# Patient Record
Sex: Female | Born: 2000 | Race: Black or African American | Hispanic: No | Marital: Single | Smoking: Never smoker
Health system: Southern US, Community
[De-identification: ages and names within clinical notes are randomized; demographics above are authoritative.]

## PROBLEM LIST (undated history)

## (undated) ENCOUNTER — Inpatient Hospital Stay (HOSPITAL_COMMUNITY): Payer: Self-pay

## (undated) DIAGNOSIS — D649 Anemia, unspecified: Secondary | ICD-10-CM

## (undated) DIAGNOSIS — D249 Benign neoplasm of unspecified breast: Secondary | ICD-10-CM

## (undated) HISTORY — PX: NO PAST SURGERIES: SHX2092

## (undated) HISTORY — DX: Anemia, unspecified: D64.9

---
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
## 2021-06-24 ENCOUNTER — Other Ambulatory Visit: Payer: Self-pay | Admitting: Pediatrics

## 2021-06-24 DIAGNOSIS — D241 Benign neoplasm of right breast: Secondary | ICD-10-CM

## 2021-07-08 ENCOUNTER — Ambulatory Visit
Admission: RE | Admit: 2021-07-08 | Discharge: 2021-07-08 | Disposition: A | Discharge status: Home / Self Care | Payer: Medicaid Other | Source: Ambulatory Visit | Attending: Pediatrics | Admitting: Pediatrics

## 2021-07-08 ENCOUNTER — Other Ambulatory Visit: Payer: Self-pay

## 2021-07-08 ENCOUNTER — Other Ambulatory Visit: Payer: Self-pay | Admitting: Pediatrics

## 2021-07-08 DIAGNOSIS — D241 Benign neoplasm of right breast: Secondary | ICD-10-CM

## 2021-07-09 ENCOUNTER — Ambulatory Visit
Admission: RE | Admit: 2021-07-09 | Discharge: 2021-07-09 | Disposition: A | Discharge status: Home / Self Care | Payer: Medicaid Other | Source: Ambulatory Visit | Attending: Pediatrics | Admitting: Pediatrics

## 2021-07-09 ENCOUNTER — Other Ambulatory Visit: Payer: Self-pay | Admitting: Pediatrics

## 2021-07-09 DIAGNOSIS — D241 Benign neoplasm of right breast: Secondary | ICD-10-CM

## 2021-08-03 ENCOUNTER — Ambulatory Visit: Payer: Self-pay | Admitting: Surgery

## 2021-08-03 DIAGNOSIS — D241 Benign neoplasm of right breast: Secondary | ICD-10-CM

## 2021-08-11 ENCOUNTER — Other Ambulatory Visit: Payer: Self-pay | Admitting: Surgery

## 2021-08-11 DIAGNOSIS — D241 Benign neoplasm of right breast: Secondary | ICD-10-CM

## 2021-08-25 ENCOUNTER — Encounter (HOSPITAL_BASED_OUTPATIENT_CLINIC_OR_DEPARTMENT_OTHER): Payer: Self-pay | Admitting: Surgery

## 2021-08-25 ENCOUNTER — Other Ambulatory Visit: Payer: Self-pay

## 2021-09-01 ENCOUNTER — Encounter (HOSPITAL_BASED_OUTPATIENT_CLINIC_OR_DEPARTMENT_OTHER)
Admission: RE | Admit: 2021-09-01 | Discharge: 2021-09-01 | Disposition: A | Discharge status: Home / Self Care | Payer: Medicaid Other | Source: Ambulatory Visit | Attending: Surgery | Admitting: Surgery

## 2021-09-01 ENCOUNTER — Other Ambulatory Visit: Payer: Self-pay

## 2021-09-01 ENCOUNTER — Ambulatory Visit
Admission: RE | Admit: 2021-09-01 | Discharge: 2021-09-01 | Disposition: A | Discharge status: Home / Self Care | Payer: Medicaid Other | Source: Ambulatory Visit | Attending: Surgery | Admitting: Surgery

## 2021-09-01 ENCOUNTER — Other Ambulatory Visit: Payer: Self-pay | Admitting: Surgery

## 2021-09-01 DIAGNOSIS — D241 Benign neoplasm of right breast: Secondary | ICD-10-CM | POA: Diagnosis not present

## 2021-09-01 DIAGNOSIS — Z01812 Encounter for preprocedural laboratory examination: Secondary | ICD-10-CM | POA: Insufficient documentation

## 2021-09-01 LAB — CBC WITH DIFFERENTIAL/PLATELET
Abs Immature Granulocytes: 0.01 10*3/uL (ref 0.00–0.07)
Basophils Absolute: 0.1 10*3/uL (ref 0.0–0.1)
Basophils Relative: 1 %
Eosinophils Absolute: 0.1 10*3/uL (ref 0.0–0.5)
Eosinophils Relative: 1 %
HCT: 34 % — ABNORMAL LOW (ref 36.0–46.0)
Hemoglobin: 10.4 g/dL — ABNORMAL LOW (ref 12.0–15.0)
Immature Granulocytes: 0 %
Lymphocytes Relative: 34 %
Lymphs Abs: 2.3 10*3/uL (ref 0.7–4.0)
MCH: 22.7 pg — ABNORMAL LOW (ref 26.0–34.0)
MCHC: 30.6 g/dL (ref 30.0–36.0)
MCV: 74.2 fL — ABNORMAL LOW (ref 80.0–100.0)
Monocytes Absolute: 0.5 10*3/uL (ref 0.1–1.0)
Monocytes Relative: 8 %
Neutro Abs: 3.9 10*3/uL (ref 1.7–7.7)
Neutrophils Relative %: 56 %
Platelets: 323 10*3/uL (ref 150–400)
RBC: 4.58 MIL/uL (ref 3.87–5.11)
RDW: 17.8 % — ABNORMAL HIGH (ref 11.5–15.5)
WBC: 6.9 10*3/uL (ref 4.0–10.5)
nRBC: 0 % (ref 0.0–0.2)

## 2021-09-01 LAB — COMPREHENSIVE METABOLIC PANEL
ALT: 13 U/L (ref 0–44)
AST: 17 U/L (ref 15–41)
Albumin: 4 g/dL (ref 3.5–5.0)
Alkaline Phosphatase: 49 U/L (ref 38–126)
Anion gap: 5 (ref 5–15)
BUN: 13 mg/dL (ref 6–20)
CO2: 26 mmol/L (ref 22–32)
Calcium: 9.6 mg/dL (ref 8.9–10.3)
Chloride: 106 mmol/L (ref 98–111)
Creatinine, Ser: 0.63 mg/dL (ref 0.44–1.00)
GFR, Estimated: >60 mL/min (ref >60)
Glucose, Bld: 88 mg/dL (ref 70–99)
Potassium: 3.6 mmol/L (ref 3.5–5.1)
Sodium: 137 mmol/L (ref 135–145)
Total Bilirubin: 0.5 mg/dL (ref 0.3–1.2)
Total Protein: 7.2 g/dL (ref 6.5–8.1)

## 2021-09-01 LAB — POCT PREGNANCY, URINE: Preg Test, Ur: NEGATIVE

## 2021-09-01 NOTE — Progress Notes (Signed)

## 2021-09-02 ENCOUNTER — Encounter (HOSPITAL_BASED_OUTPATIENT_CLINIC_OR_DEPARTMENT_OTHER)
Admission: RE | Disposition: A | Discharge status: Home / Self Care | Payer: Self-pay | Source: Home / Self Care | Attending: Surgery

## 2021-09-02 ENCOUNTER — Other Ambulatory Visit: Payer: Self-pay

## 2021-09-02 ENCOUNTER — Ambulatory Visit (HOSPITAL_BASED_OUTPATIENT_CLINIC_OR_DEPARTMENT_OTHER): Payer: Medicaid Other | Admitting: Anesthesiology

## 2021-09-02 ENCOUNTER — Ambulatory Visit (HOSPITAL_BASED_OUTPATIENT_CLINIC_OR_DEPARTMENT_OTHER)
Admission: RE | Admit: 2021-09-02 | Discharge: 2021-09-02 | Disposition: A | Discharge status: Home / Self Care | Payer: Medicaid Other | Attending: Surgery | Admitting: Surgery

## 2021-09-02 ENCOUNTER — Encounter (HOSPITAL_BASED_OUTPATIENT_CLINIC_OR_DEPARTMENT_OTHER): Payer: Self-pay | Admitting: Surgery

## 2021-09-02 ENCOUNTER — Ambulatory Visit
Admission: RE | Admit: 2021-09-02 | Discharge: 2021-09-02 | Disposition: A | Discharge status: Home / Self Care | Payer: Medicaid Other | Source: Ambulatory Visit | Attending: Surgery | Admitting: Surgery

## 2021-09-02 DIAGNOSIS — D241 Benign neoplasm of right breast: Secondary | ICD-10-CM | POA: Insufficient documentation

## 2021-09-02 HISTORY — PX: BREAST LUMPECTOMY WITH RADIOACTIVE SEED LOCALIZATION: SHX6424

## 2021-09-02 HISTORY — DX: Benign neoplasm of unspecified breast: D24.9

## 2021-09-02 SURGERY — BREAST LUMPECTOMY WITH RADIOACTIVE SEED LOCALIZATION
Anesthesia: General | Site: Breast | Laterality: Right

## 2021-09-02 MED ORDER — ACETAMINOPHEN 500 MG PO TABS
ORAL_TABLET | ORAL | Status: AC
  Filled 2021-09-02: qty 2

## 2021-09-02 MED ORDER — SODIUM CHLORIDE 0.9 % IV SOLN
INTRAVENOUS | Status: DC | PRN
  Administered 2021-09-02: 100 mL

## 2021-09-02 MED ORDER — MIDAZOLAM HCL 5 MG/5ML IJ SOLN
INTRAMUSCULAR | Status: DC | PRN
  Administered 2021-09-02: 2 mg via INTRAVENOUS

## 2021-09-02 MED ORDER — CEFAZOLIN SODIUM-DEXTROSE 2-4 GM/100ML-% IV SOLN
2.0000 g | INTRAVENOUS | Status: AC
  Administered 2021-09-02: 2 g via INTRAVENOUS

## 2021-09-02 MED ORDER — FENTANYL CITRATE (PF) 100 MCG/2ML IJ SOLN
INTRAMUSCULAR | Status: AC
  Filled 2021-09-02: qty 2

## 2021-09-02 MED ORDER — BUPIVACAINE-EPINEPHRINE (PF) 0.25% -1:200000 IJ SOLN
INTRAMUSCULAR | Status: DC | PRN
  Administered 2021-09-02: 18 mL

## 2021-09-02 MED ORDER — CEFAZOLIN SODIUM-DEXTROSE 2-4 GM/100ML-% IV SOLN
INTRAVENOUS | Status: AC
  Filled 2021-09-02: qty 100

## 2021-09-02 MED ORDER — FENTANYL CITRATE (PF) 100 MCG/2ML IJ SOLN
INTRAMUSCULAR | Status: DC | PRN
  Administered 2021-09-02 (×2): 25 ug via INTRAVENOUS
  Administered 2021-09-02: 50 ug via INTRAVENOUS

## 2021-09-02 MED ORDER — DEXAMETHASONE SODIUM PHOSPHATE 4 MG/ML IJ SOLN
INTRAMUSCULAR | Status: DC | PRN
  Administered 2021-09-02: 4 mg via INTRAVENOUS

## 2021-09-02 MED ORDER — LACTATED RINGERS IV SOLN: INTRAVENOUS | Status: DC

## 2021-09-02 MED ORDER — CHLORHEXIDINE GLUCONATE CLOTH 2 % EX PADS: 6.0000 | MEDICATED_PAD | Freq: Once | CUTANEOUS | Status: DC

## 2021-09-02 MED ORDER — IBUPROFEN 800 MG PO TABS: 800.0000 mg | ORAL_TABLET | Freq: Three times a day (TID) | ORAL | 0 refills | Status: DC | PRN

## 2021-09-02 MED ORDER — SCOPOLAMINE 1 MG/3DAYS TD PT72: 1.0000 | MEDICATED_PATCH | Freq: Once | TRANSDERMAL | Status: DC

## 2021-09-02 MED ORDER — VANCOMYCIN HCL 500 MG IV SOLR
INTRAVENOUS | Status: DC | PRN
  Administered 2021-09-02: 500 mg via TOPICAL

## 2021-09-02 MED ORDER — MIDAZOLAM HCL 2 MG/2ML IJ SOLN
INTRAMUSCULAR | Status: AC
  Filled 2021-09-02: qty 2

## 2021-09-02 MED ORDER — ACETAMINOPHEN 500 MG PO TABS
1000.0000 mg | ORAL_TABLET | ORAL | Status: AC
  Administered 2021-09-02: 1000 mg via ORAL

## 2021-09-02 MED ORDER — LIDOCAINE 2% (20 MG/ML) 5 ML SYRINGE
INTRAMUSCULAR | Status: DC | PRN
  Administered 2021-09-02: 60 mg via INTRAVENOUS

## 2021-09-02 MED ORDER — ONDANSETRON HCL 4 MG/2ML IJ SOLN
INTRAMUSCULAR | Status: DC | PRN
  Administered 2021-09-02: 4 mg via INTRAVENOUS

## 2021-09-02 MED ORDER — PROPOFOL 10 MG/ML IV BOLUS
INTRAVENOUS | Status: DC | PRN
  Administered 2021-09-02: 200 mg via INTRAVENOUS

## 2021-09-02 MED ORDER — HYDROCODONE-ACETAMINOPHEN 5-325 MG PO TABS: 1.0000 | ORAL_TABLET | Freq: Four times a day (QID) | ORAL | 0 refills | Status: DC | PRN

## 2021-09-02 MED ORDER — SODIUM CHLORIDE 0.9 % IV SOLN
INTRAVENOUS | Status: AC
  Filled 2021-09-02: qty 10

## 2021-09-02 SURGICAL SUPPLY — 51 items
ADH SKN CLS APL DERMABOND .7 (GAUZE/BANDAGES/DRESSINGS) ×1
APL PRP STRL LF DISP 70% ISPRP (MISCELLANEOUS) ×1
APPLIER CLIP 9.375 MED OPEN (MISCELLANEOUS)
APR CLP MED 9.3 20 MLT OPN (MISCELLANEOUS)
BINDER BREAST LRG (GAUZE/BANDAGES/DRESSINGS) IMPLANT
BINDER BREAST MEDIUM (GAUZE/BANDAGES/DRESSINGS) IMPLANT
BINDER BREAST XLRG (GAUZE/BANDAGES/DRESSINGS) ×2 IMPLANT
BINDER BREAST XXLRG (GAUZE/BANDAGES/DRESSINGS) IMPLANT
BLADE SURG 15 STRL LF DISP TIS (BLADE) ×1 IMPLANT
BLADE SURG 15 STRL SS (BLADE) ×2
CANISTER SUC SOCK COL 7IN (MISCELLANEOUS) IMPLANT
CANISTER SUCT 1200ML W/VALVE (MISCELLANEOUS) ×2 IMPLANT
CHLORAPREP W/TINT 26 (MISCELLANEOUS) ×2 IMPLANT
CLIP APPLIE 9.375 MED OPEN (MISCELLANEOUS) IMPLANT
COVER BACK TABLE 60X90IN (DRAPES) ×2 IMPLANT
COVER MAYO STAND STRL (DRAPES) ×2 IMPLANT
COVER PROBE W GEL 5X96 (DRAPES) ×2 IMPLANT
DECANTER SPIKE VIAL GLASS SM (MISCELLANEOUS) IMPLANT
DERMABOND ADVANCED (GAUZE/BANDAGES/DRESSINGS) ×1
DERMABOND ADVANCED .7 DNX12 (GAUZE/BANDAGES/DRESSINGS) ×1 IMPLANT
DRAPE LAPAROSCOPIC ABDOMINAL (DRAPES) IMPLANT
DRAPE LAPAROTOMY 100X72 PEDS (DRAPES) ×2 IMPLANT
DRAPE UTILITY XL STRL (DRAPES) ×2 IMPLANT
ELECT COATED BLADE 2.86 ST (ELECTRODE) ×2 IMPLANT
ELECT REM PT RETURN 9FT ADLT (ELECTROSURGICAL) ×2
ELECTRODE REM PT RTRN 9FT ADLT (ELECTROSURGICAL) ×1 IMPLANT
GLOVE SRG 8 PF TXTR STRL LF DI (GLOVE) ×1 IMPLANT
GLOVE SURG LTX SZ8 (GLOVE) ×2 IMPLANT
GLOVE SURG UNDER POLY LF SZ8 (GLOVE) ×2
GOWN STRL REUS W/ TWL LRG LVL3 (GOWN DISPOSABLE) ×2 IMPLANT
GOWN STRL REUS W/ TWL XL LVL3 (GOWN DISPOSABLE) ×1 IMPLANT
GOWN STRL REUS W/TWL LRG LVL3 (GOWN DISPOSABLE) ×4
GOWN STRL REUS W/TWL XL LVL3 (GOWN DISPOSABLE) ×2
HEMOSTAT ARISTA ABSORB 3G PWDR (HEMOSTASIS) IMPLANT
HEMOSTAT SNOW SURGICEL 2X4 (HEMOSTASIS) IMPLANT
KIT MARKER MARGIN INK (KITS) ×2 IMPLANT
NDL HYPO 25X1 1.5 SAFETY (NEEDLE) ×1 IMPLANT
NEEDLE HYPO 25X1 1.5 SAFETY (NEEDLE) ×2 IMPLANT
NS IRRIG 1000ML POUR BTL (IV SOLUTION) ×2 IMPLANT
PACK BASIN DAY SURGERY FS (CUSTOM PROCEDURE TRAY) ×2 IMPLANT
PENCIL SMOKE EVACUATOR (MISCELLANEOUS) ×2 IMPLANT
SLEEVE SCD COMPRESS KNEE MED (STOCKING) ×2 IMPLANT
SPONGE T-LAP 4X18 ~~LOC~~+RFID (SPONGE) ×2 IMPLANT
SUT MNCRL AB 4-0 PS2 18 (SUTURE) ×2 IMPLANT
SUT SILK 2 0 SH (SUTURE) IMPLANT
SUT VICRYL 3-0 CR8 SH (SUTURE) ×2 IMPLANT
SYR CONTROL 10ML LL (SYRINGE) ×2 IMPLANT
TOWEL GREEN STERILE FF (TOWEL DISPOSABLE) ×2 IMPLANT
TRAY FAXITRON CT DISP (TRAY / TRAY PROCEDURE) ×2 IMPLANT
TUBE CONNECTING 20X1/4 (TUBING) ×1 IMPLANT
YANKAUER SUCT BULB TIP NO VENT (SUCTIONS) ×1 IMPLANT

## 2021-09-02 NOTE — H&P (Signed)
Subjective   Chief Complaint: breast surgery consult   History of Present Illness: Kimberly Wise is a 20 y.o. female who is seen today as an office consultation at the request of Dr. Redmond Baseman for evaluation of breast surgery consult Enlarging right breast mass   Patient relates a 3-year history of right breast mass. She has multiple masses in both breasts which were evaluated in 2019 with ultrasound. On the right side in the subareolar position, a previously measured 3.2 cm mass consistent with fibroadenoma has increased to 6.4 cm. Core biopsy showed fibroadenoma. She had multiple other masses noted with some incremental changes but with the ultrasound appearance of fibroadenoma. No family history of breast issues. No significant pain at this point time.  Review of Systems: A complete review of systems was obtained from the patient. I have reviewed this information and discussed as appropriate with the patient. See HPI as well for other ROS.  ROS   Medical History: Past Medical History:  Diagnosis Date   Anemia   There is no problem list on file for this patient.  No past surgical history on file.   No Known Allergies  No current outpatient medications on file prior to visit.   No current facility-administered medications on file prior to visit.   No family history on file.   Social History   Tobacco Use  Smoking Status Never Smoker  Smokeless Tobacco Never Used    Social History   Socioeconomic History   Marital status: Single  Tobacco Use   Smoking status: Never Smoker   Smokeless tobacco: Never Used  Scientific laboratory technician Use: Never used  Substance and Sexual Activity   Alcohol use: Not Currently   Drug use: Never   Objective:   Vitals:  08/03/21 0921  BP: 118/80  Pulse: 88  Temp: 36.7 C (98 F)  SpO2: 100%  Weight: 76.9 kg (169 lb 9.6 oz)  Height: 172.7 cm ('5\' 8"'$ )   Body mass index is 25.79 kg/m.    General appearance - alert, well appearing, and in  no distress Mental status - alert, oriented to person, place, and time Neck - supple, no significant adenopathy, no axillary adenopathy  Lymphatics - no palpable lymphadenopathy Breasts - breasts appear normal, no suspicious masses, no skin or nipple changes or axillary nodes, right breast mass posterior to NAC 6 cm mobily rubbery Neurological - alert, oriented, normal speech, no focal findings or movement disorder noted Musculoskeletal - no muscular tenderness noted Breasts are dense Appropriate for age   Labs, Imaging and Diagnostic Testing:  CLINICAL DATA: Follow-up for probably benign RIGHT breast masses. The patient was last evaluated 07/18/2018 for numerous hypoechoic circumscribed masses for which ultrasound was recommended in 6 months. Patient believes mass in the retroareolar region of the RIGHT breast is larger.   EXAM: ULTRASOUND OF THE RIGHT BREAST   COMPARISON: 07/18/2018   FINDINGS: On physical exam, I palpate firm mobile smooth mass in the retroareolar region of the RIGHT breast. I palpate adjacent masses in the 7 o'clock location of the RIGHT breast and a smaller mobile mass in the 2 o'clock location.   Targeted ultrasound is performed, showing numerous hypoechoic masses:   In the 6 o'clock location 2 centimeters from the nipple, circumscribed oval parallel hypoechoic mass has posterior acoustic enhancement measures 2.9 x 1.4 x 2.5 centimeters. Previously this mass measured 1.6 x 1.1 x 1.9 centimeters.   Just superficial to this mass is a hypoechoic parallel oval circumscribed mass  measuring 1.4 x 0.4 x 1.8 centimeters. Previously this mass measured 0.9 x 0.5 x 0.9 centimeters.   In the 7 o'clock location 3 centimeters from the nipple a circumscribed oval hypoechoic parallel mass was not previously imaged and measures 3.6 x 3.8 x 1.4 centimeters.   In the 4 o'clock retroareolar region, a circumscribed oval hypoechoic parallel mass with posterior acoustic  enhancement is 6.4 x 5.9 x 3.0 centimeters. Previously this mass measured 3.2 x 1.5 x 3.0 centimeters.   In the 2 o'clock location 4 centimeters from the nipple, oval hypoechoic oval circumscribed mass is 2.0 x 2.0 x 1.1 centimeters. This mass was not imaged previously.   Evaluation of the RIGHT axilla shows mildly prominent lymph nodes.   IMPRESSION: Increased size of all previously imaged masses in the RIGHT breast. Largest mass in the retroareolar region of the RIGHT breast, measuring 6.4 centimeters, previously 3.2 centimeters. There are 2 new masses in the 7 o'clock and 2 o'clock locations of the RIGHT breast.   RECOMMENDATION: Recommend ultrasound-guided core biopsy of mass in the 4 o'clock retroareolar region of the RIGHT breast.   I have discussed the findings and recommendations with the patient. If applicable, a reminder letter will be sent to the patient regarding the next appointment.   BI-RADS CATEGORY 4: Suspicious.     Electronically Signed By: Nolon Nations M.D. On: 07/08/2021 11:18  Diagnosis Breast, right, needle core biopsy, 4 o'clock, retroareolar - FIBROADENOMA. - NO EVIDENCE OF MALIGNANCY. Assessment and Plan:  Diagnoses and all orders for this visit:  Fibroadenoma of breast, right    Discussed right breast seed localized lumpectomy. Pros and cons of surgery discussed complications of cosmesis, nipple loss and/or numbness, disfiguration, the need for other operations given her multiple other nodules which are more than likely fibroadenomas and future ability to breast-feed. Given increasing size she has opted for right breast seed localized lumpectomy. Risk of bleeding. Infection. nipple loss, numbness, cosmetic change and need for other operations. Observation discussed as other option. Long term expectations discussed. She agrees to proceed   No follow-ups on file.  Kennieth Francois, MD

## 2021-09-02 NOTE — Interval H&P Note (Signed)
History and Physical Interval Note:  09/02/2021 10:34 AM  Kimberly Wise  has presented today for surgery, with the diagnosis of [place].  The various methods of treatment have been discussed with the patient and family. After consideration of risks, benefits and other options for treatment, the patient has consented to  Procedure(s): RIGHT BREAST LUMPECTOMY WITH RADIOACTIVE SEED LOCALIZATION (Right) as a surgical intervention.  The patient's history has been reviewed, patient examined, no change in status, stable for surgery.  I have reviewed the patient's chart and labs.  Questions were answered to the patient's satisfaction.     [name]

## 2021-09-02 NOTE — Anesthesia Postprocedure Evaluation (Signed)
Anesthesia Post Note  Patient: Scientist, product/process development  Procedure(s) Performed: RIGHT BREAST LUMPECTOMY WITH RADIOACTIVE SEED LOCALIZATION (Right: Breast)     Patient location during evaluation: PACU Anesthesia Type: General Level of consciousness: awake and alert Pain management: pain level controlled Vital Signs Assessment: post-procedure vital signs reviewed and stable Respiratory status: spontaneous breathing, nonlabored ventilation and respiratory function stable Cardiovascular status: blood pressure returned to baseline and stable Postop Assessment: no apparent nausea or vomiting Anesthetic complications: no   No notable events documented.  Last Vitals:  Vitals:   09/02/21 1215 09/02/21 1230  BP: 111/70 113/78  Pulse: 65 78  Resp: 12 14  Temp:    SpO2: 100% 100%    Last Pain:  Vitals:   09/02/21 1230  TempSrc:   PainSc: 0-No pain                 Lynda Rainwater

## 2021-09-02 NOTE — Op Note (Signed)
Preoperative diagnosis:  Right breast fibroadenoma  Postoperative diagnosis: Same  Procedure: right breast seed localized lumpectomy of large right breast fibroadenoma central  Surgeon: Erroll Luna, MD  Assistant: Dr. Drucie Ip, MD  I was personally present during the key and critical portions of this procedure and immediately available throughout the entire procedure, as documented in my operative note.   Anesthesia: LMA with 0.25% Marcaine plain  EBL: Minimal  Drains none  Indications for procedure: The patient is an 20 year old female with a large right breast fibroadenoma that is growing.  It was core biopsy proven to be a fibroadenoma but due to increase in size she desired excision.  Risks and benefits of surgery as well as cosmetic deformity and potential damage to her nipple and potential breast-feeding issues were discussed with the patient.  Risk of bleeding, infection, cosmesis, breast-feeding, pain, cosmetic deformity, the need further treatments and/or procedures discussed.  Observation discussed.  She opted for lumpectomy of the larger 1.  She  has other fibroadenomas as well but these had not changed much.  Description of procedure: The patient was met in the holding area.  Neoprobe used to identify the seed in her right breast.  The procedure was reviewed as well as complications.  She agreed to proceed.  She was then taken back to the operating room.  She is placed supine upon the OR table.  After induction of general esthesia the right breast was prepped and draped in a sterile fashion and timeout performed.  Proper patient, site and procedure were verified and films were available for review.  Neoprobe used to identify the seed at about 6:00 at the level of the nipple areolar complex.  Curvilinear incision was made along the inferior border of the nipple areolar complex.  The mass was easily visible.  We are able to dissect this out bluntly.  We remove the entire mass  which measured well over 6 cm x 7 cm.  The Faxitron image revealed the seed and clip to be in the specimen.  Just inferior to that at the 7 to 8 o'clock position was an extension of this.  I went ahead and dissected this out as well this had the appearance of a fibroadenoma.  This was removed in its entirety.  Both specimens were sent to pathology for further evaluation.  There were no other large areas of fibroadenoma noted on exam.  These 2 are almost contiguous with each other.  Hemostasis was then achieved with cautery.  The wound was irrigated.  Vancomycin powder placed in the cavity.  Wound was then closed with 3-0 Vicryl and 4-0 Monocryl.  Local anesthetic was infiltrated around the lumpectomy cavity.  Dermabond applied.  All counts found to be correct.  Breast binder placed.  The patient was awoke extubated taken to recovery in satisfactory condition.

## 2021-09-02 NOTE — Anesthesia Preprocedure Evaluation (Signed)
Anesthesia Evaluation  Patient identified by MRN, date of birth, ID band Patient awake    Reviewed: Allergy & Precautions, NPO status , Patient's Chart, lab work & pertinent test results  History of Anesthesia Complications Negative for: history of anesthetic complications  Airway Mallampati: I  TM Distance: >3 FB Neck ROM: Full    Dental no notable dental hx.    Pulmonary neg pulmonary ROS,    Pulmonary exam normal        Cardiovascular negative cardio ROS Normal cardiovascular exam     Neuro/Psych negative neurological ROS  negative psych ROS   GI/Hepatic negative GI ROS, (+)     substance abuse  marijuana use,   Endo/Other  negative endocrine ROS  Renal/GU negative Renal ROS  negative genitourinary   Musculoskeletal negative musculoskeletal ROS (+)   Abdominal   Peds  Hematology  (+) anemia , Hgb 10.4   Anesthesia Other Findings Right breast fibroadenoma  Reproductive/Obstetrics                            Anesthesia Physical Anesthesia Plan  ASA: 2  Anesthesia Plan: General   Post-op Pain Management:    Induction: Intravenous  PONV Risk Score and Plan: 3 and Treatment may vary due to age or medical condition, Ondansetron, Dexamethasone, Midazolam and Scopolamine patch - Pre-op  Airway Management Planned: LMA  Additional Equipment: None  Intra-op Plan:   Post-operative Plan: Extubation in OR  Informed Consent: I have reviewed the patients History and Physical, chart, labs and discussed the procedure including the risks, benefits and alternatives for the proposed anesthesia with the patient or authorized representative who has indicated his/her understanding and acceptance.     Dental advisory given  Plan Discussed with: CRNA  Anesthesia Plan Comments:         Anesthesia Quick Evaluation

## 2021-09-02 NOTE — Anesthesia Procedure Notes (Signed)
Procedure Name: LMA Insertion Date/Time: 09/02/2021 11:07 AM Performed by: Signe Colt, CRNA Pre-anesthesia Checklist: Patient identified, Emergency Drugs available, Suction available and Patient being monitored Patient Re-evaluated:Patient Re-evaluated prior to induction Oxygen Delivery Method: Circle System Utilized Preoxygenation: Pre-oxygenation with 100% oxygen Induction Type: IV induction Ventilation: Mask ventilation without difficulty LMA: LMA inserted LMA Size: 4.0 Number of attempts: 1 Airway Equipment and Method: bite block Placement Confirmation: positive ETCO2 Tube secured with: Tape Dental Injury: Teeth and Oropharynx as per pre-operative assessment

## 2021-09-02 NOTE — Discharge Instructions (Addendum)
[facility] Office Phone Number [phone]  BREAST BIOPSY/ PARTIAL MASTECTOMY: POST OP INSTRUCTIONS  Always review your discharge instruction sheet given to you by the facility where your surgery was performed.  IF YOU HAVE DISABILITY OR FAMILY LEAVE FORMS, YOU MUST BRING THEM TO THE OFFICE FOR PROCESSING.  DO NOT GIVE THEM TO YOUR DOCTOR.  A prescription for pain medication may be given to you upon discharge.  Take your pain medication as prescribed, if needed.  If narcotic pain medicine is not needed, then you may take acetaminophen (Tylenol) or ibuprofen (Advil) as needed. Take your usually prescribed medications unless otherwise directed If you need a refill on your pain medication, please contact your pharmacy.  They will contact our office to request authorization.  Prescriptions will not be filled after 5pm or on week-ends. You should eat very light the first 24 hours after surgery, such as soup, crackers, pudding, etc.  Resume your normal diet the day after surgery. Most patients will experience some swelling and bruising in the breast.  Ice packs and a good support bra will help.  Swelling and bruising can take several days to resolve.  It is common to experience some constipation if taking pain medication after surgery.  Increasing fluid intake and taking a stool softener will usually help or prevent this problem from occurring.  A mild laxative (Milk of Magnesia or Miralax) should be taken according to package directions if there are no bowel movements after 48 hours. Unless discharge instructions indicate otherwise, you may remove your bandages 24-48 hours after surgery, and you may shower at that time.  You may have steri-strips (small skin tapes) in place directly over the incision.  These strips should be left on the skin for 7-10 days.  If your surgeon used skin glue on the incision, you may shower in 24 hours.  The glue will flake off over the next 2-3 weeks.  Any  sutures or staples will be removed at the office during your follow-up visit. ACTIVITIES:  You may resume regular daily activities (gradually increasing) beginning the next day.  Wearing a good support bra or sports bra minimizes pain and swelling.  You may have sexual intercourse when it is comfortable. You may drive when you no longer are taking prescription pain medication, you can comfortably wear a seatbelt, and you can safely maneuver your car and apply brakes. RETURN TO WORK:  ______________________________________________________________________________________ You should see your doctor in the office for a follow-up appointment approximately two weeks after your surgery.  Your doctor's nurse will typically make your follow-up appointment when she calls you with your pathology report.  Expect your pathology report 2-3 business days after your surgery.  You may call to check if you do not hear from us after three days. OTHER INSTRUCTIONS: _______________________________________________________________________________________________ _____________________________________________________________________________________________________________________________________ _____________________________________________________________________________________________________________________________________ _____________________________________________________________________________________________________________________________________  WHEN TO CALL YOUR DOCTOR: Fever over 101.0 Nausea and/or vomiting. Extreme swelling or bruising. Continued bleeding from incision. Increased pain, redness, or drainage from the incision.  The clinic staff is available to answer your questions during regular business hours.  Please don't hesitate to call and ask to speak to one of the nurses for clinical concerns.  If you have a medical emergency, go to the nearest emergency room or call 911.  A surgeon from [facility] is always on call at the hospital.  For further questions, please visit [email or web address]    Post Anesthesia Home Care Instructions  Activity: Get plenty of rest for the remainder of   remainder of the day. A responsible individual must stay with you for 24 hours following the procedure.  For the next 24 hours, DO NOT: -Drive a car -Paediatric nurse -Drink alcoholic beverages -Take any medication unless instructed by your physician -Make any legal decisions or sign important papers.  Meals: Start with liquid foods such as gelatin or soup. Progress to regular foods as tolerated. Avoid greasy, spicy, heavy foods. If nausea and/or vomiting occur, drink only clear liquids until the nausea and/or vomiting subsides. Call your physician if vomiting continues.  Special Instructions/Symptoms: Your throat may feel dry or sore from the anesthesia or the breathing tube placed in your throat during surgery. If this causes discomfort, gargle with warm salt water. The discomfort should disappear within 24 hours.  If you had a scopolamine patch placed behind your ear for the management of post- operative nausea and/or vomiting:  1. The medication in the patch is effective for 72 hours, after which it should be removed.  Wrap patch in a tissue and discard in the trash. Wash hands thoroughly with soap and water. 2. You may remove the patch earlier than 72 hours if you experience unpleasant side effects which may include dry mouth, dizziness or visual disturbances. 3. Avoid touching the patch. Wash your hands with soap and water after contact with the patch.  No tylenol until after 4pm if needed today.              RETURN TO SCHOOL 09/07/2021  No restrictions

## 2021-09-02 NOTE — Transfer of Care (Signed)
Immediate Anesthesia Transfer of Care Note  Patient: Kimberly Wise  Procedure(s) Performed: RIGHT BREAST LUMPECTOMY WITH RADIOACTIVE SEED LOCALIZATION (Right: Breast)  Patient Location: PACU  Anesthesia Type:General  Level of Consciousness: drowsy and patient cooperative  Airway & Oxygen Therapy: Patient Spontanous Breathing and Patient connected to face mask oxygen  Post-op Assessment: Report given to RN and Post -op Vital signs reviewed and stable  Post vital signs: Reviewed and stable  Last Vitals:  Vitals Value Taken Time  BP    Temp    Pulse 66 09/02/21 1213  Resp    SpO2 96 % 09/02/21 1213  Vitals shown include unvalidated device data.  Last Pain:  Vitals:   09/02/21 0937  TempSrc: Oral  PainSc: 0-No pain      Patients Stated Pain Goal: 7 ([id number])  Complications: No notable events documented.

## 2021-09-03 ENCOUNTER — Encounter (HOSPITAL_BASED_OUTPATIENT_CLINIC_OR_DEPARTMENT_OTHER): Payer: Self-pay | Admitting: Surgery

## 2021-09-04 LAB — SURGICAL PATHOLOGY

## 2021-09-05 ENCOUNTER — Encounter: Payer: Self-pay | Admitting: Surgery

## 2022-04-14 ENCOUNTER — Encounter (HOSPITAL_COMMUNITY): Payer: Self-pay

## 2022-06-13 IMAGING — US US BREAST*R* LIMITED INC AXILLA
1 series · 13 of 25 positions shown · non-contrast
Comparison: 07/18/2018

CLINICAL DATA: Follow-up for probably benign RIGHT breast masses.
The patient was last evaluated 07/18/2018 for numerous hypoechoic
circumscribed masses for which ultrasound was recommended in 6
months. Patient believes mass in the retroareolar region of the
RIGHT breast is larger.

EXAM:
ULTRASOUND OF THE RIGHT BREAST

[Series 1: us breast*right* limited inc axilla · 0.06mm/px · 13 of 27 slices shown]
[im 1/27]
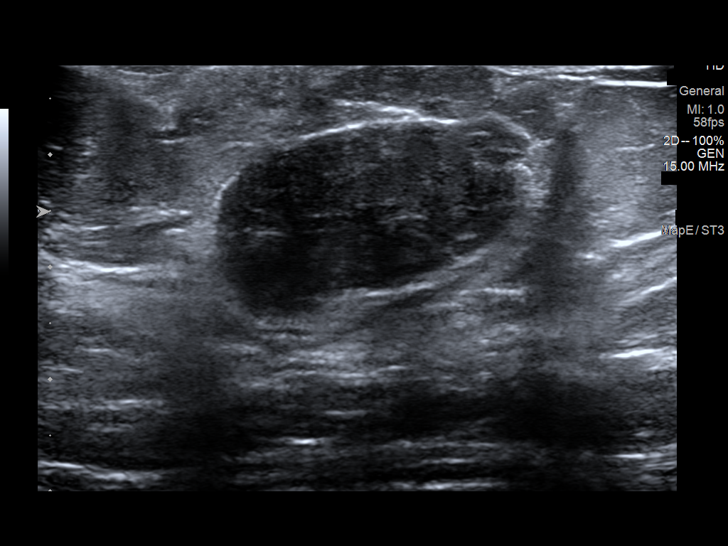
[im 3/27]
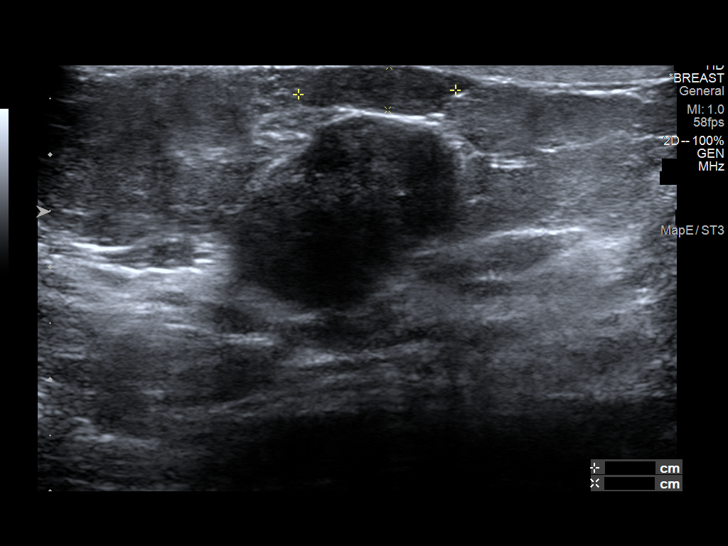
[im 5/27]
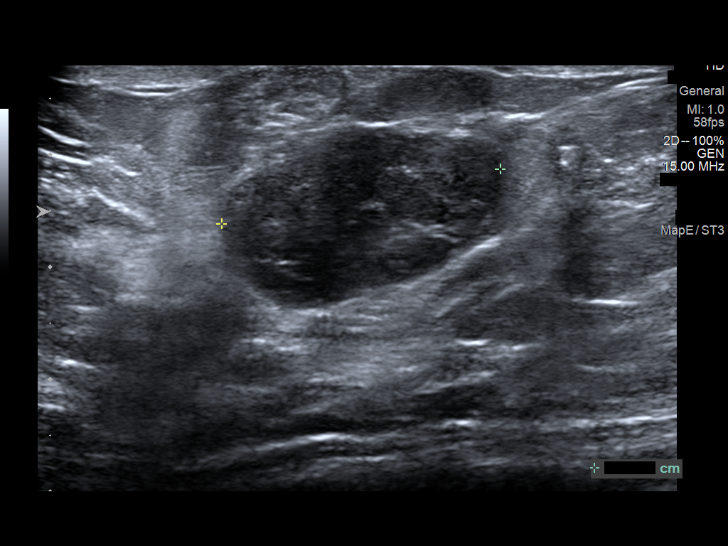
[im 7/27]
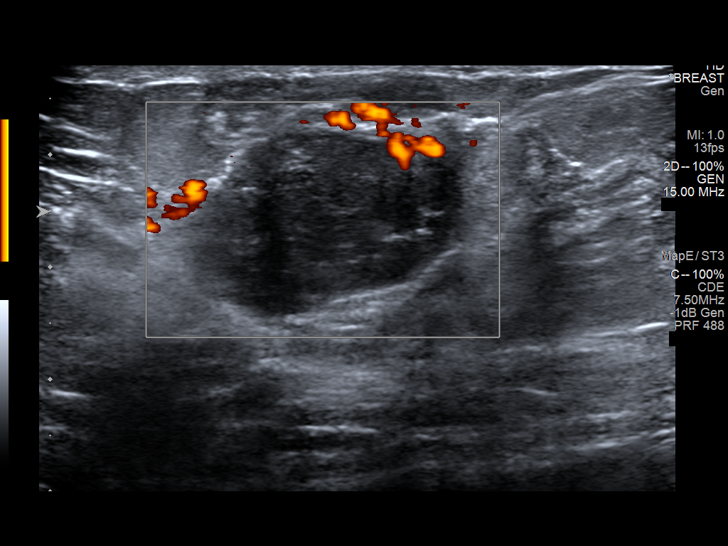
[im 9/27]
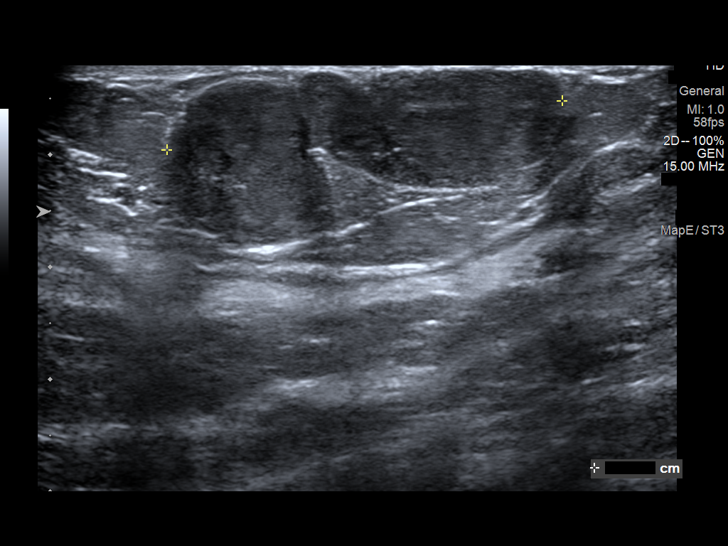
[im 11/27]
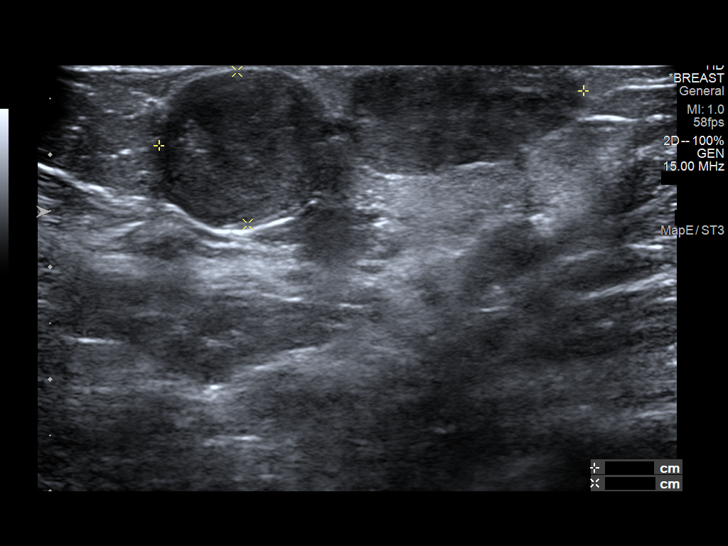
[im 14/27]
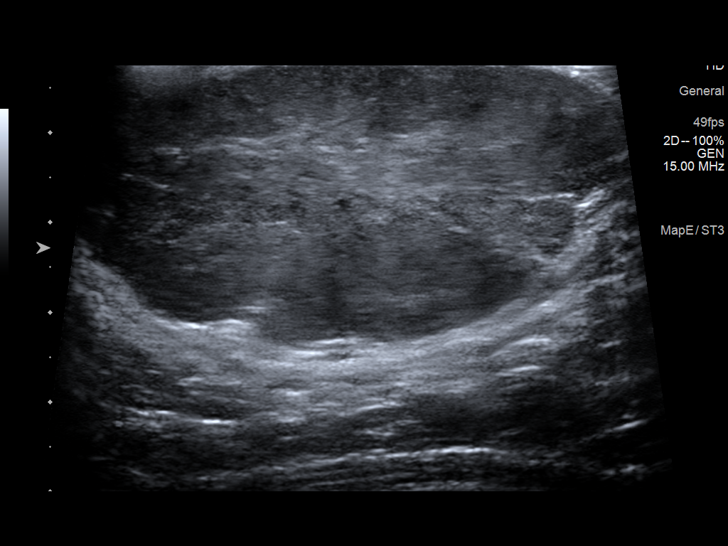
[im 16/27]
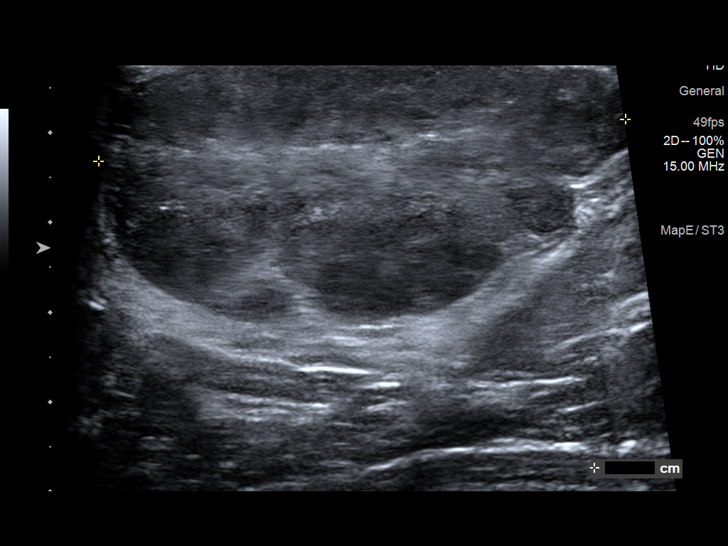
[im 18/27]
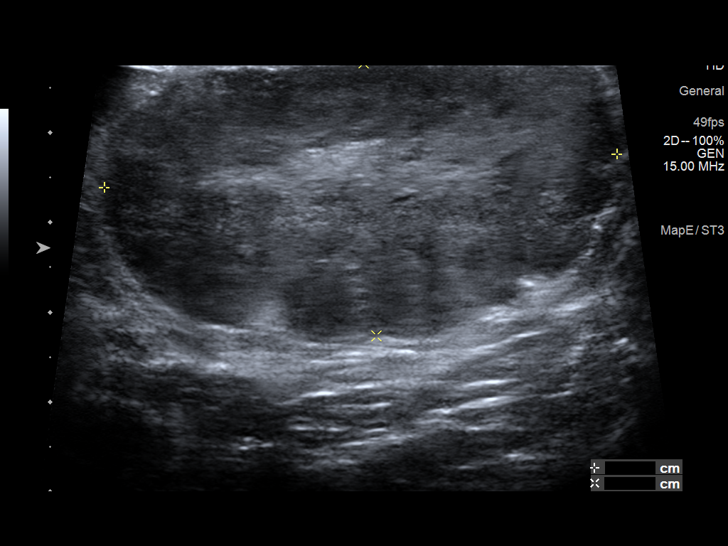
[im 20/27]
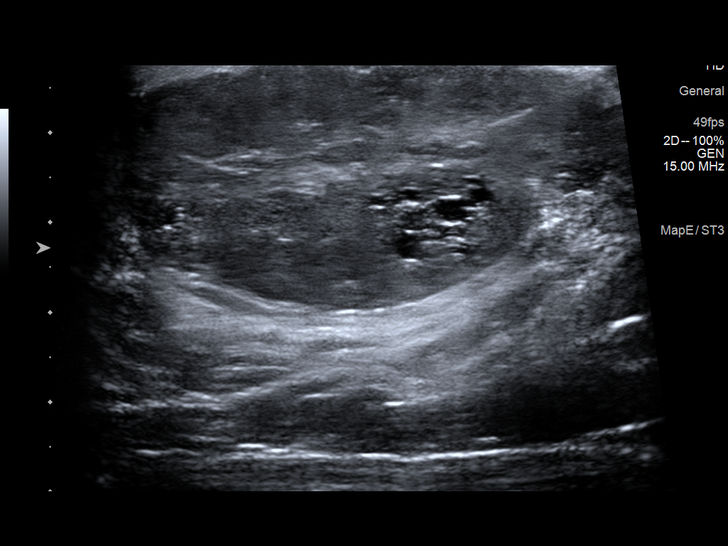
[im 22/27]
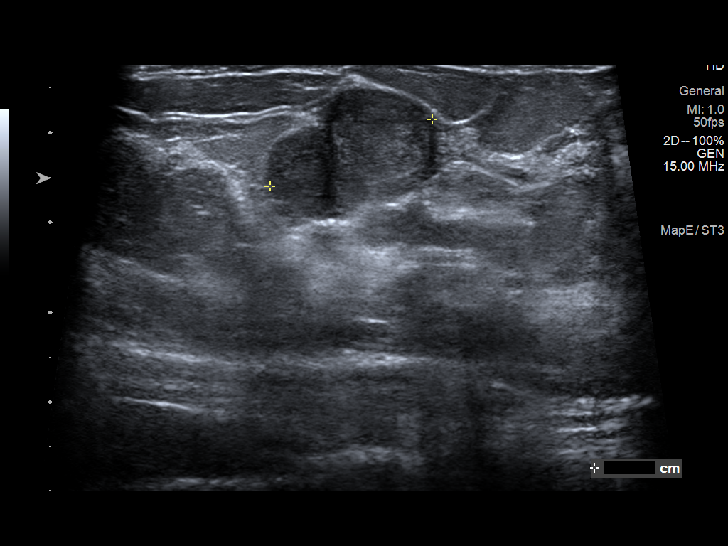
[im 24/27]
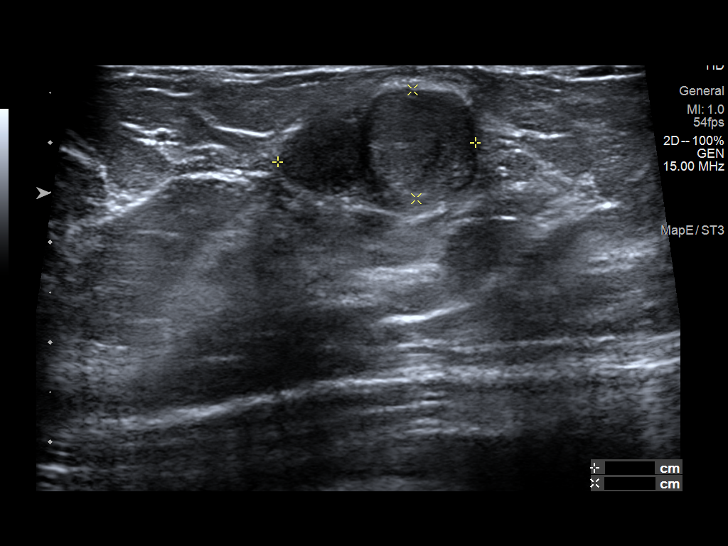
[im 27/27]
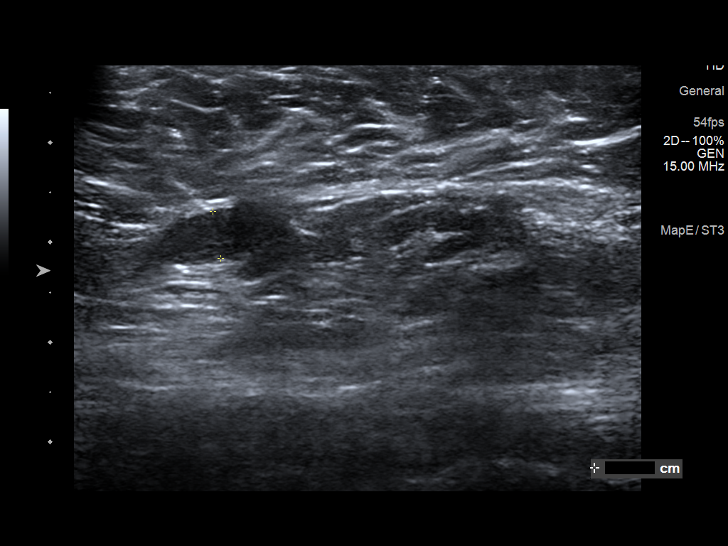

[13 of 25 positions shown; findings below may reference images not displayed]

FINDINGS: On physical exam, I palpate firm mobile smooth mass in the
retroareolar region of the RIGHT breast. I palpate adjacent masses
in the 7 o'clock location of the RIGHT breast and a smaller mobile
mass in the 2 o'clock location.

Targeted ultrasound is performed, showing numerous hypoechoic
masses:

In the 6 o'clock location 2 centimeters from the nipple,
circumscribed oval parallel hypoechoic mass has posterior acoustic
enhancement measures 2.9 x 1.4 x 2.5 centimeters. Previously this
mass measured 1.6 x 1.1 x 1.9 centimeters.

Just superficial to this mass is a hypoechoic parallel oval
circumscribed mass measuring 1.4 x 0.4 x 1.8 centimeters. Previously
this mass measured 0.9 x 0.5 x 0.9 centimeters.

In the 7 o'clock location 3 centimeters from the nipple a
circumscribed oval hypoechoic parallel mass was not previously
imaged and measures 3.6 x 3.8 x 1.4 centimeters.

In the 4 o'clock retroareolar region, a circumscribed oval
hypoechoic parallel mass with posterior acoustic enhancement is
x 5.9 x 3.0 centimeters. Previously this mass measured 3.2 x 1.5 x
3.0 centimeters.

In the 2 o'clock location 4 centimeters from the nipple, oval
hypoechoic oval circumscribed mass is 2.0 x 2.0 x 1.1 centimeters.
This mass was not imaged previously.

Evaluation of the RIGHT axilla shows mildly prominent lymph nodes.
IMPRESSION: Increased size of all previously imaged masses in the RIGHT breast.
Largest mass in the retroareolar region of the RIGHT breast,
measuring 6.4 centimeters, previously 3.2 centimeters. There are 2
new masses in the 7 o'clock and 2 o'clock locations of the RIGHT
breast.

RECOMMENDATION:
Recommend ultrasound-guided core biopsy of mass in the 4 o'clock
retroareolar region of the RIGHT breast.

I have discussed the findings and recommendations with the patient.
If applicable, a reminder letter will be sent to the patient
regarding the next appointment.

BI-RADS CATEGORY  4: Suspicious.

## 2022-06-14 IMAGING — US US  BREAST BX W/ LOC DEV 1ST LESION IMG BX SPEC US GUIDE*R*
1 series · 12 of 15 positions shown · non-contrast
Comparison: Previous exam(s).
COMPARISON: Previous exam(s).

Addendum:
CLINICAL DATA: Patient presents for ultrasound-guided core needle
biopsy of a large, 4 o'clock, retroareolar right breast mass. She
was found multiple circumscribed similar appearing right breast
masses on diagnostic ultrasound dated 07/08/2021 some new and others
enlarged compared to an ultrasound dated 07/18/2018. The 4 o'clock
retroareolar mass is the largest of the 5 discrete right breast
masses.

EXAM:
ULTRASOUND GUIDED RIGHT BREAST CORE NEEDLE BIOPSY

[Series 1: us breast bx w/ loc dev 1st lesion img bx spec us  · 0.07mm/px · 12 of 15 slices shown]
[im 1/15]
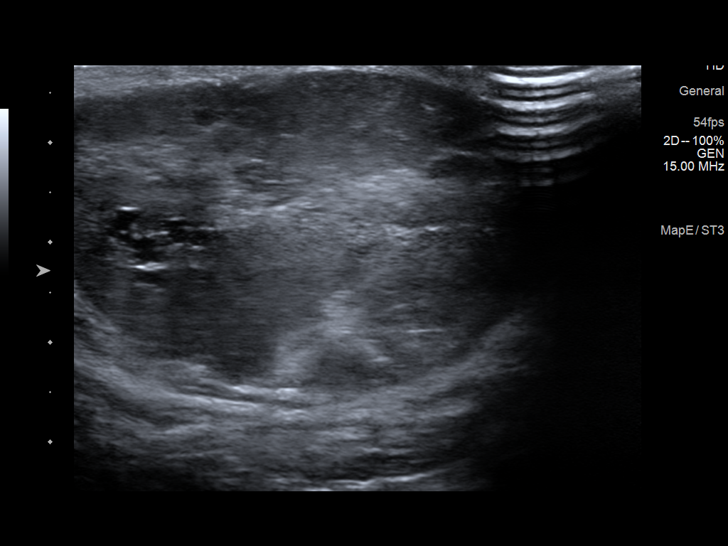
[im 2/15]
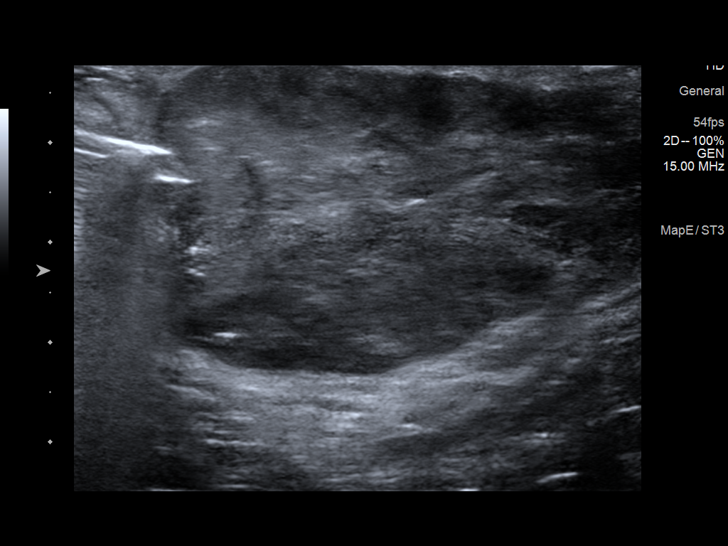
[im 4/15]
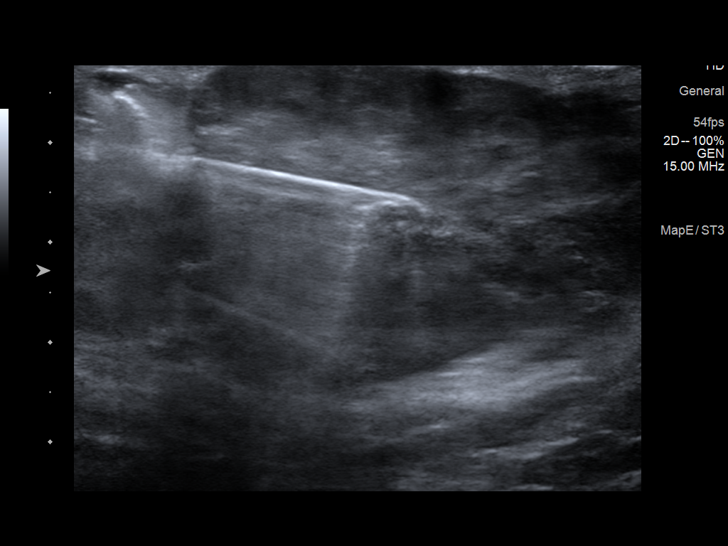
[im 5/15]
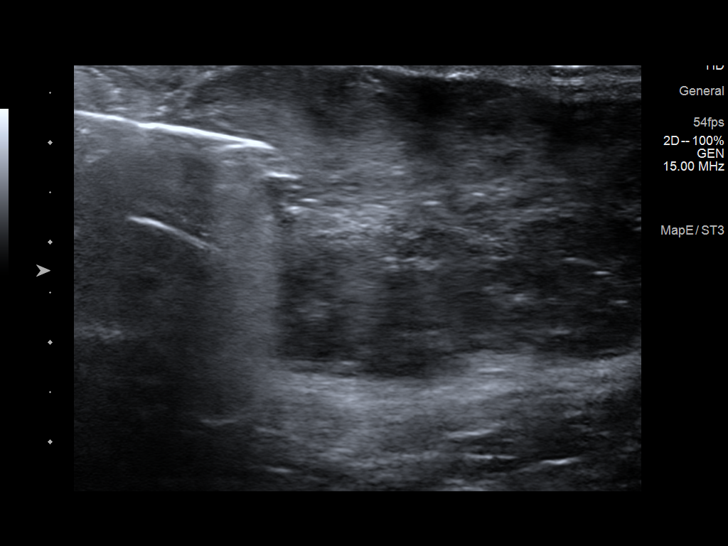
[im 6/15]
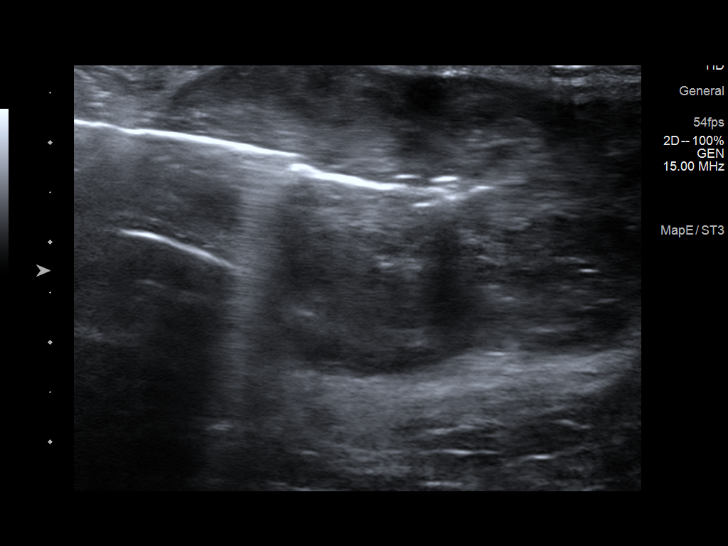
[im 7/15]
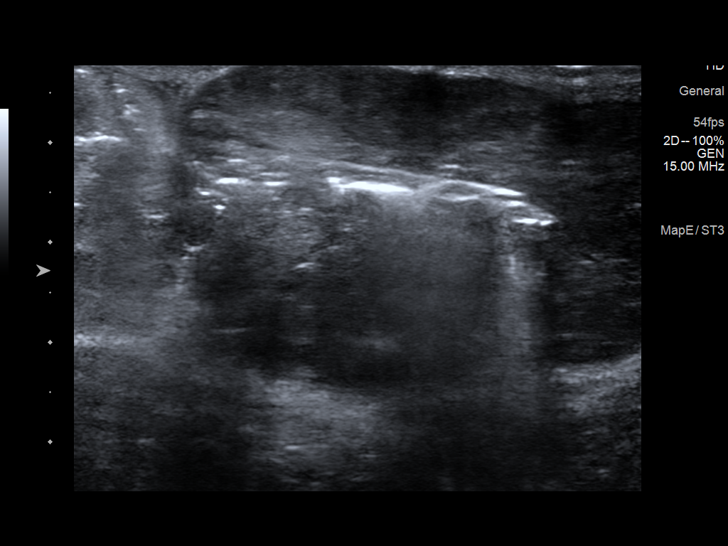
[im 9/15]
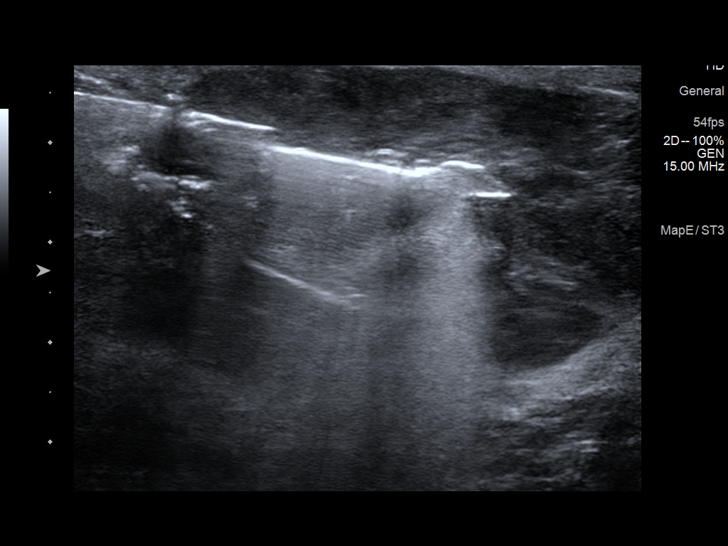
[im 10/15]
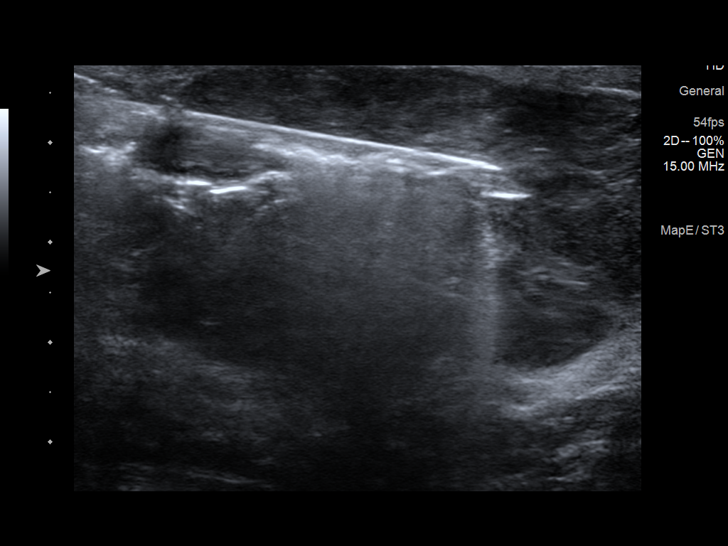
[im 11/15]
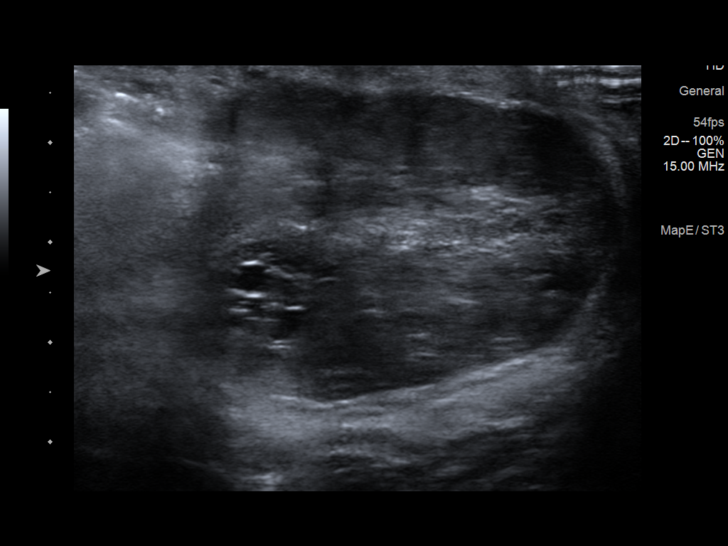
[im 12/15]
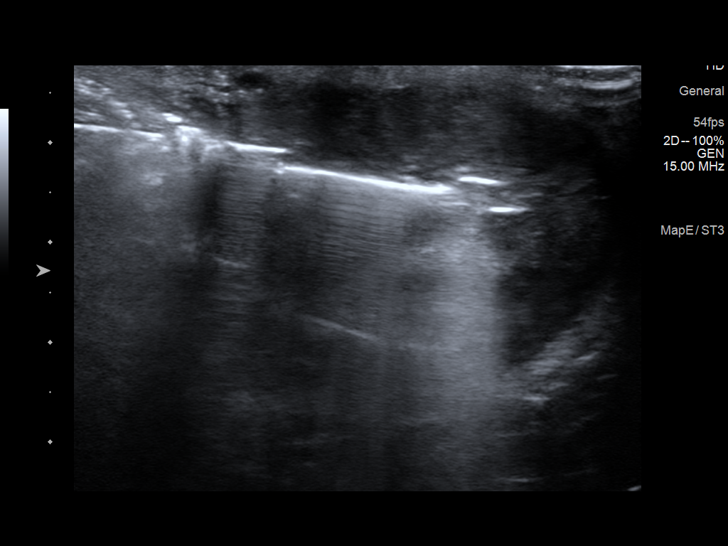
[im 14/15]
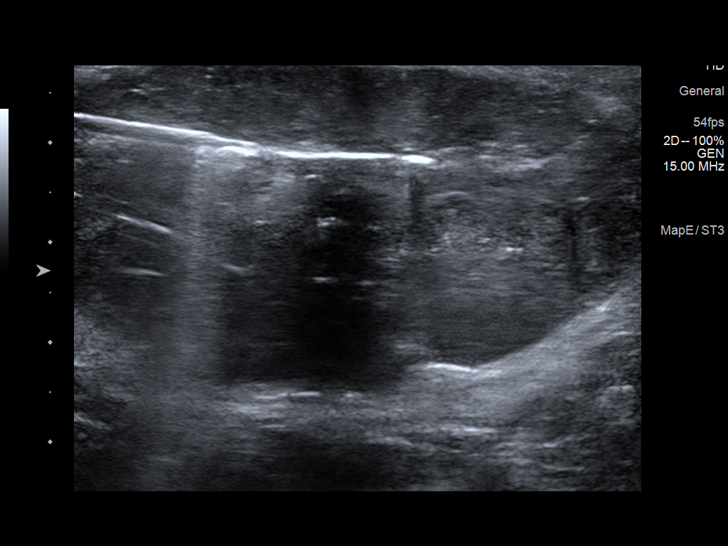
[im 15/15]
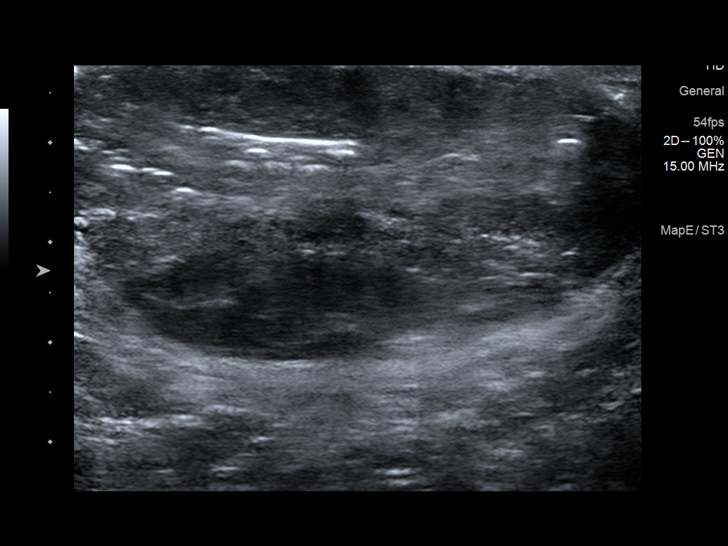

[12 of 15 positions shown; findings below may reference images not displayed]



Lesion quadrant: Lower inner quadrant: 4 o'clock, retroareolar.

Using sterile technique and 1% Lidocaine as local anesthetic, under
direct ultrasound visualization, a 12 gauge Lyn device was
used to perform biopsy of the large retroareolar right breast mass
using a inferomedial approach. At the conclusion of the procedure a
TriBell tissue marker clip was deployed into the biopsy cavity. The
clip was well seen deploying within the mass on sonography.
IMPRESSION: Ultrasound guided biopsy of a right breast mass. No apparent
complications.

ADDENDUM:
Pathology revealed FIBROADENOMA- NO EVIDENCE OF MALIGNANCY of the
RIGHT breast, 4 o'clock, retrareolar. This was found to be
concordant by Dr. Conaire Tha.

Pathology results were discussed with the patient by telephone. The
patient reported doing well after the biopsy with tenderness at the
site. Post biopsy instructions and care were reviewed and questions
were answered. The patient was encouraged to call The [REDACTED]

The patient was instructed to return for right diagnostic ultrasound
in 6 months for additional masses and informed a reminder notice
would be sent regarding this appointment.

Patient desires to have surgical consultation for consideration of
excision of enlarging biopsy-proven fibroadenoma. Surgical
consultation has been arranged with Dr. Auris La Bella at [REDACTED] on August 03, 2021.

Pathology results reported by Afonso Eduardo Zi Nho RN on 07/13/2021.



Lesion quadrant: Lower inner quadrant: 4 o'clock, retroareolar.

Using sterile technique and 1% Lidocaine as local anesthetic, under
direct ultrasound visualization, a 12 gauge Lyn device was
used to perform biopsy of the large retroareolar right breast mass
using a inferomedial approach. At the conclusion of the procedure a
TriBell tissue marker clip was deployed into the biopsy cavity. The
clip was well seen deploying within the mass on sonography.
IMPRESSION: Ultrasound guided biopsy of a right breast mass. No apparent
complications.

## 2022-08-08 IMAGING — MG MM BREAST SURGICAL SPECIMEN
1 series · 2 of 2 positions shown · non-contrast
Comparison: Previous exam(s).

CLINICAL DATA: Status post surgical excision today after earlier
radioactive seed localization.

EXAM:
SPECIMEN RADIOGRAPH OF THE RIGHT BREAST

[Series 2: R · right · 0.07mm/px · 2 of 2 slices shown]
[im 1/2]
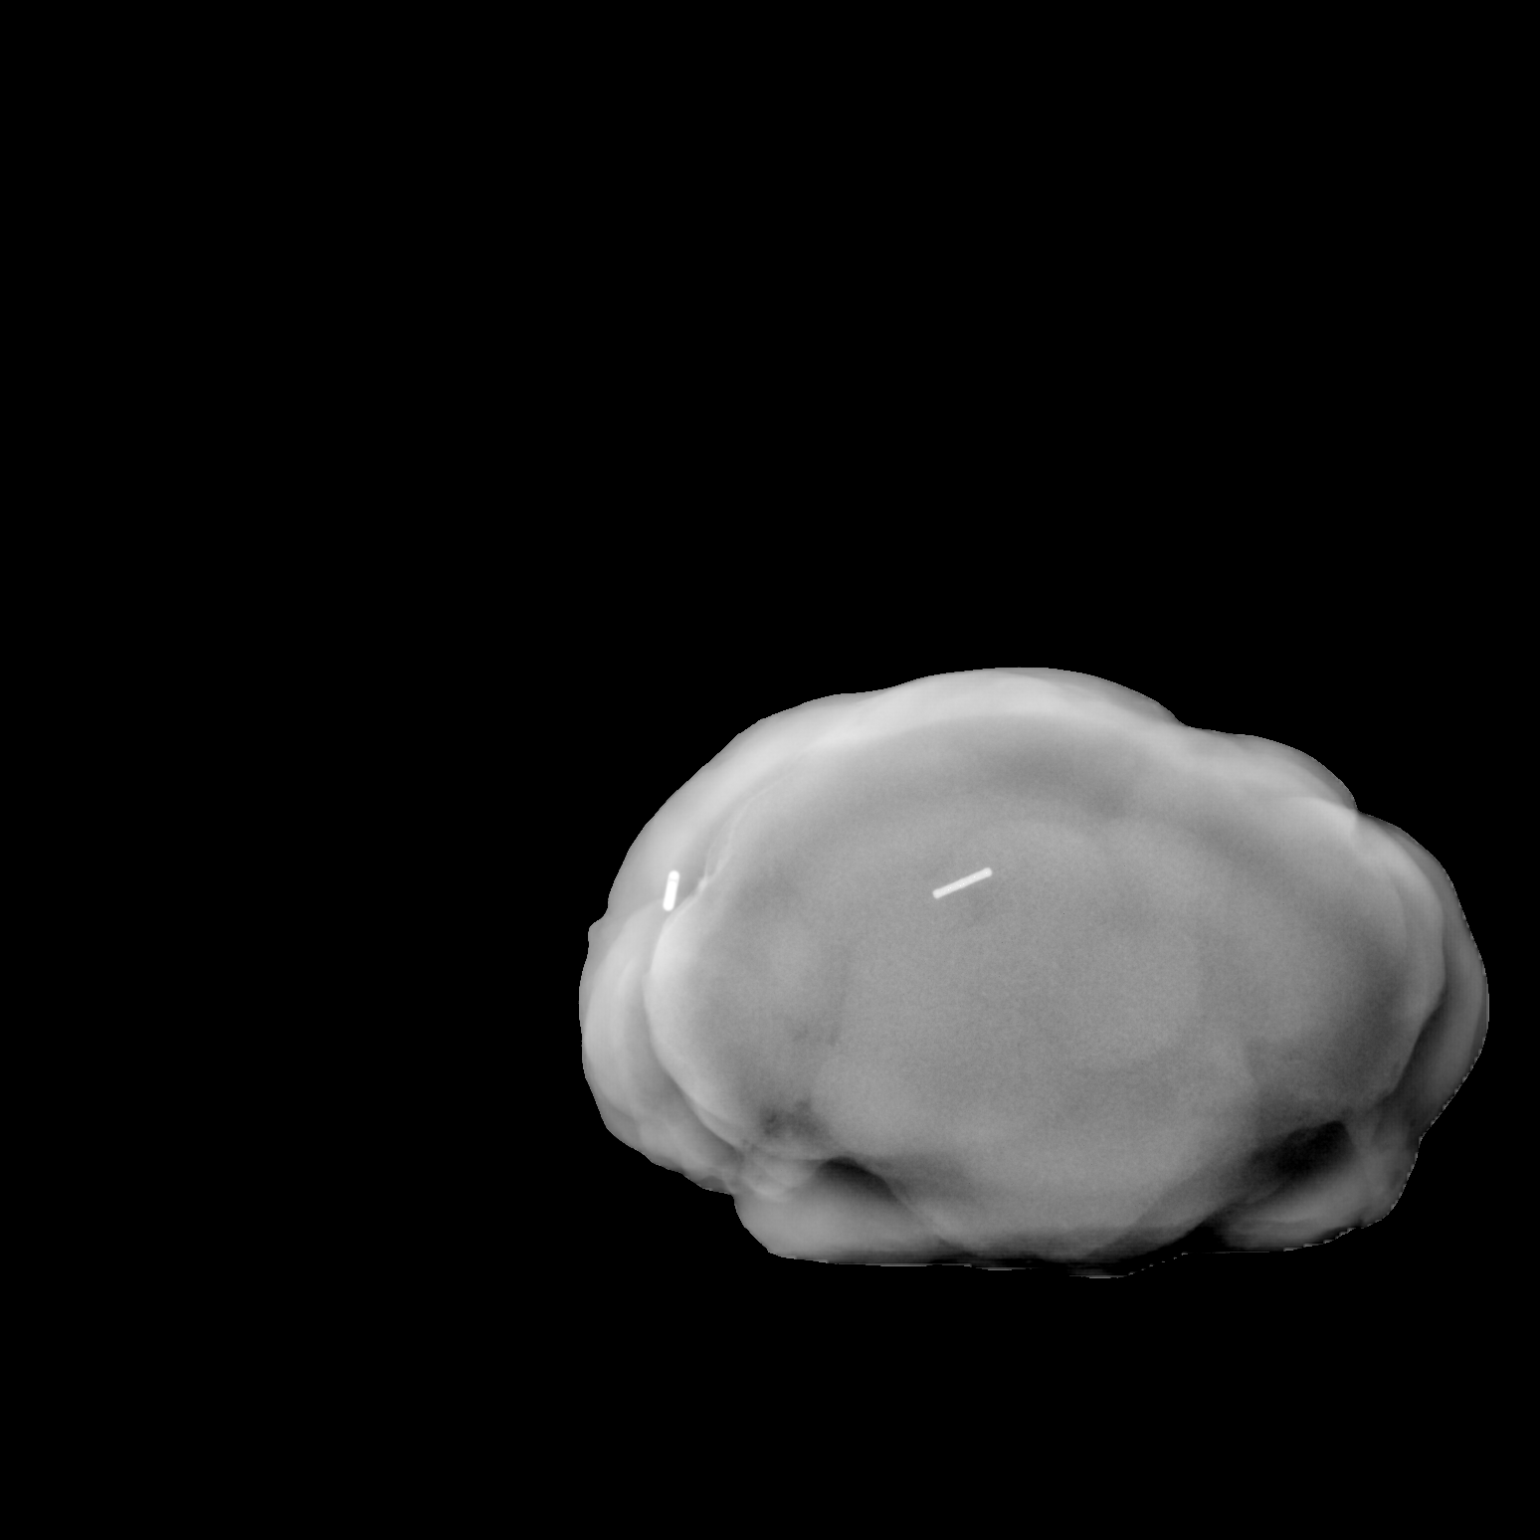
[im 2/2]
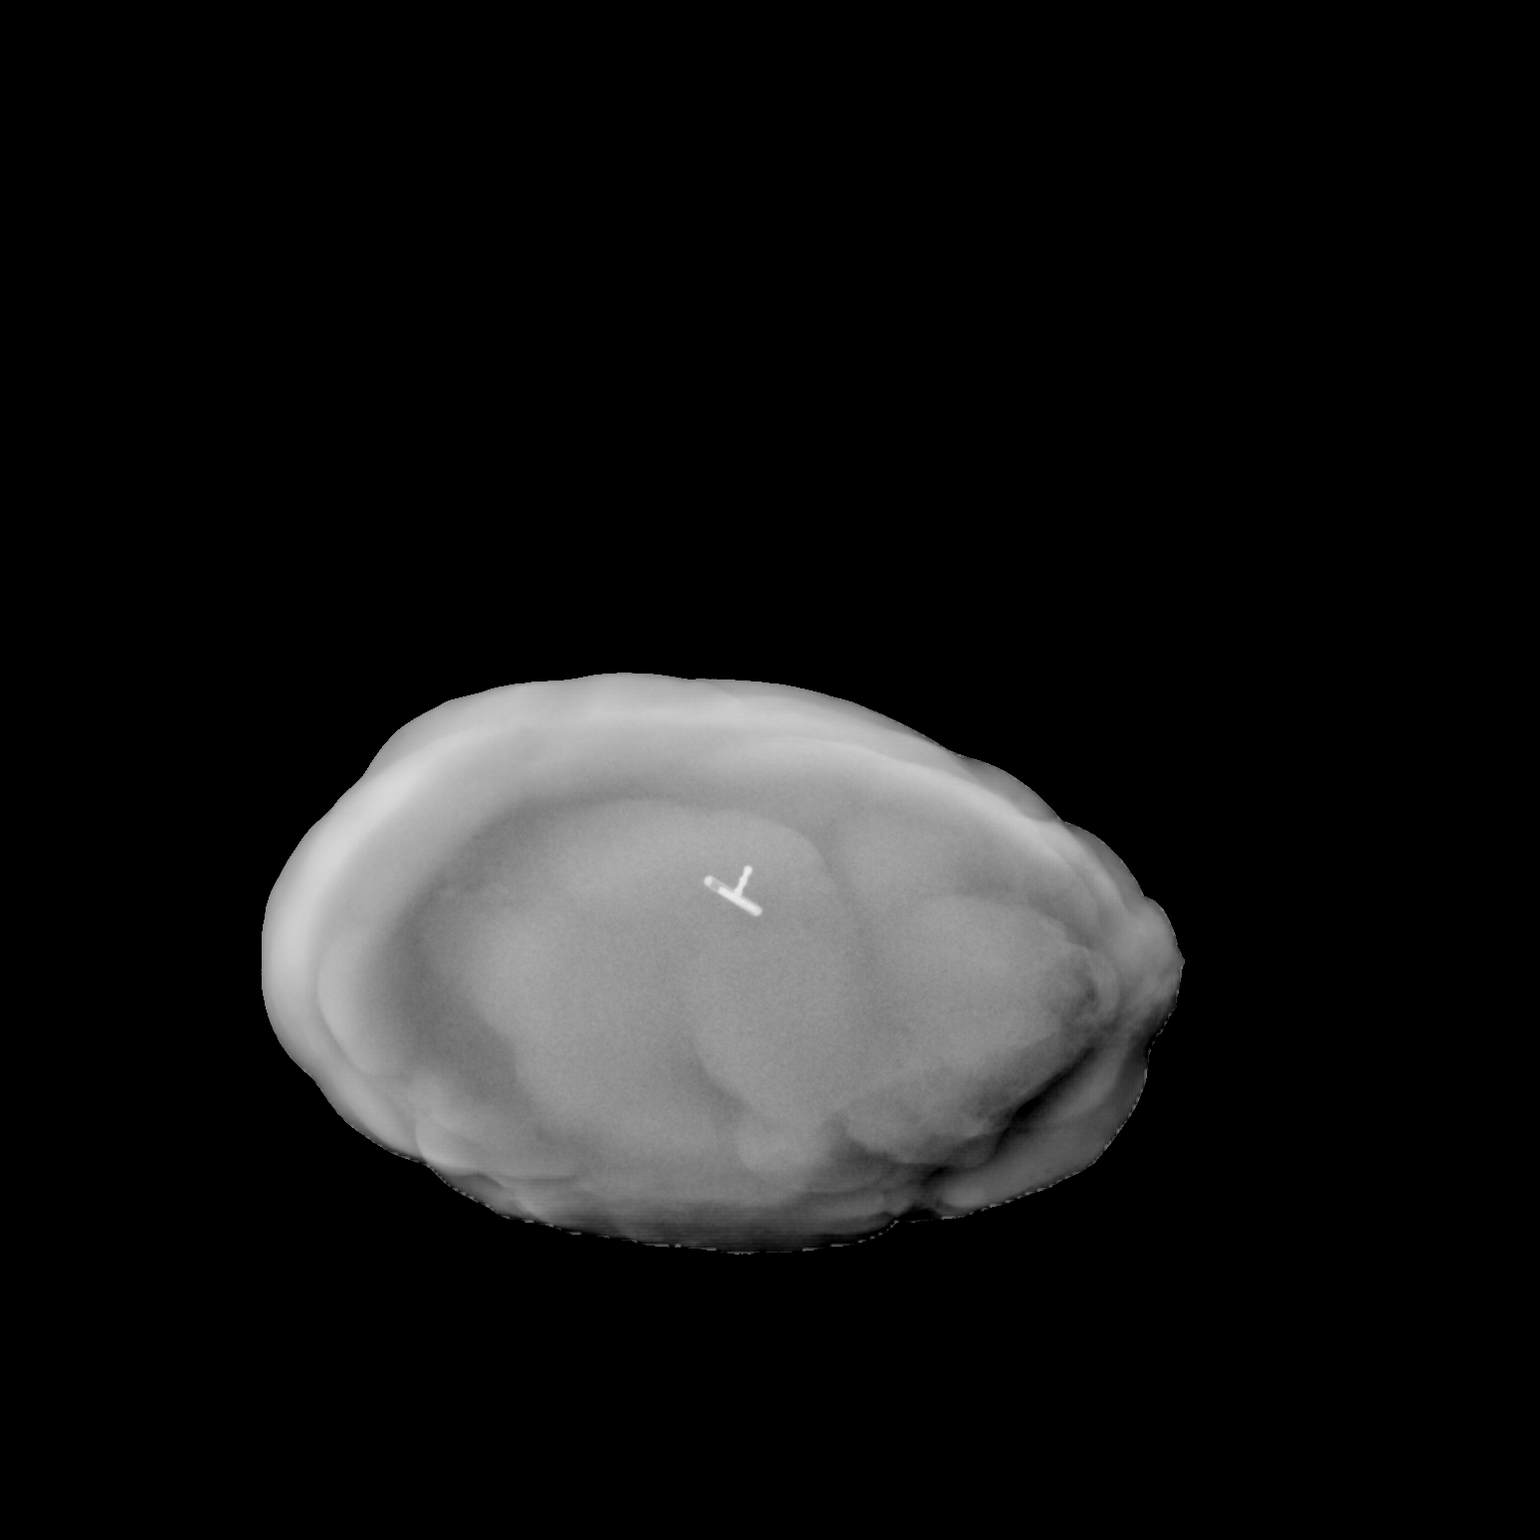

[2 of 2 positions shown; findings below may reference images not displayed]

FINDINGS: Status post excision of the right breast. The radioactive seed and
biopsy marker clip are present and completely intact within the
specimen. Findings discussed with the OR staff during the procedure.
IMPRESSION: Specimen radiograph of the right breast.

## 2023-08-17 ENCOUNTER — Ambulatory Visit (INDEPENDENT_AMBULATORY_CARE_PROVIDER_SITE_OTHER): Payer: Medicaid Other

## 2023-08-17 ENCOUNTER — Ambulatory Visit (INDEPENDENT_AMBULATORY_CARE_PROVIDER_SITE_OTHER): Payer: Medicaid Other | Admitting: Family Medicine

## 2023-08-17 ENCOUNTER — Other Ambulatory Visit: Payer: Self-pay

## 2023-08-17 VITALS — BP 122/56 | HR 72 | Wt 155.0 lb

## 2023-08-17 DIAGNOSIS — Z3481 Encounter for supervision of other normal pregnancy, first trimester: Secondary | ICD-10-CM | POA: Diagnosis not present

## 2023-08-17 DIAGNOSIS — Z3A01 Less than 8 weeks gestation of pregnancy: Secondary | ICD-10-CM

## 2023-08-17 DIAGNOSIS — O3680X0 Pregnancy with inconclusive fetal viability, not applicable or unspecified: Secondary | ICD-10-CM

## 2023-08-17 DIAGNOSIS — Z3A00 Weeks of gestation of pregnancy not specified: Secondary | ICD-10-CM

## 2023-08-17 DIAGNOSIS — Z3490 Encounter for supervision of normal pregnancy, unspecified, unspecified trimester: Secondary | ICD-10-CM

## 2023-08-17 DIAGNOSIS — Z3201 Encounter for pregnancy test, result positive: Secondary | ICD-10-CM | POA: Diagnosis not present

## 2023-08-17 DIAGNOSIS — O219 Vomiting of pregnancy, unspecified: Secondary | ICD-10-CM | POA: Diagnosis not present

## 2023-08-17 DIAGNOSIS — Z3200 Encounter for pregnancy test, result unknown: Secondary | ICD-10-CM

## 2023-08-17 LAB — POCT PREGNANCY, URINE: Preg Test, Ur: POSITIVE — AB

## 2023-08-17 MED ORDER — PROMETHAZINE HCL 12.5 MG PO TABS
12.5000 mg | ORAL_TABLET | Freq: Four times a day (QID) | ORAL | 2 refills | Status: DC | PRN
Start: 2023-08-17 — End: 2023-09-14

## 2023-08-17 MED ORDER — PROMETHAZINE HCL 12.5 MG PO TABS
12.5000 mg | ORAL_TABLET | Freq: Four times a day (QID) | ORAL | 2 refills | Status: DC | PRN
Start: 2023-08-17 — End: 2023-08-17

## 2023-08-17 MED ORDER — DOXYLAMINE-PYRIDOXINE 10-10 MG PO TBEC: 2.0000 | DELAYED_RELEASE_TABLET | Freq: Every day | ORAL | 2 refills | Status: DC

## 2023-08-17 MED ORDER — PRENATAL PLUS VITAMIN/MINERAL 27-1 MG PO TABS
1.0000 | ORAL_TABLET | Freq: Every day | ORAL | 11 refills | Status: AC
Start: 2023-08-17 — End: ?

## 2023-08-17 MED ORDER — PRENATAL PLUS VITAMIN/MINERAL 27-1 MG PO TABS: 1.0000 | ORAL_TABLET | Freq: Every day | ORAL | 11 refills | Status: DC

## 2023-08-17 NOTE — Progress Notes (Unsigned)
Possible Pregnancy  Here today for pregnancy confirmation. UPT in office today is positive. First positive home UPT 08/10/23. Reports regular menstrual period. Reviewed dating with patient:   LMP: 07/06/23 (sure LMP) EDD: 04/12/23 6w today  Reports light bleeding on Sunday, 08/14/23 with small clot. No bleeding for past 2 days but then this morning noticed spotting when wiping. Denies any abdominal pain. Reviewed with Alvester Morin, MD who recommends immediate [place]. [place] today confirms IUP and dates consistent with LMP. Newton MD to bedside to review results and meet patient.  OB history reviewed; this is first pregnancy. Patient would like to enroll with [facility]. Will schedule during checkout. Patient reports nausea and vomiting. Diclegis and Phenergan ordered per protocol. Discussed non-pharmacological options like peppermint oil (which patient is already using) and ginger candy or tea. Prenatal vitamins also sent to pharmacy.  Marjo Bicker, RN 08/17/2023  1:33 PM

## 2023-08-18 NOTE — Progress Notes (Signed)
  Amenorrhea with positive UPT PROBLEM  VISIT ENCOUNTER NOTE  Subjective:   Kimberly Wise is a 22 y.o. 1927 female here for positive pregnancy test.  She has not had serial bhcg. Reported clot passage. [place] today confirmed dates and live IUP  Denies abnormal vaginal bleeding, discharge, pelvic pain, problems with intercourse or other gynecologic concerns.    Gynecologic History Patient's last menstrual period was 07/06/2023 (exact date).  Health Maintenance Due  Topic Date Due   COVID-19 Vaccine (1) Never done   HPV VACCINES (1 - Risk 3-dose series) Never done   CHLAMYDIA SCREENING  Never done   HIV Screening  Never done   Hepatitis C Screening  Never done   DTaP/Tdap/Td (1 - Tdap) Never done   INFLUENZA VACCINE  07/28/2023    The following portions of the patient's history were reviewed and updated as appropriate: allergies, current medications, past family history, past medical history, past social history, past surgical history and problem list.  Review of Systems Pertinent items are noted in HPI.   Objective:  BP (!) 122/56   Pulse 72   Wt 155 lb (70.3 kg)   LMP 07/06/2023 (Exact Date)   BMI 25.02 kg/m  Gen: well appearing, NAD HEENT: no scleral icterus CV: RR Lung: Normal WOB Ext: warm well perfused   Assessment and Plan:  1. Possible pregnancy UP positive and US showed lived IUP  2. Nausea/vomiting in pregnancy - Doxylamine-Pyridoxine (DICLEGIS) 10-10 MG TBEC; Take 2 tablets by mouth at bedtime. May add 1 tablet at breakfast and 1 tablet at lunch if needed.  Dispense: 100 tablet; Refill: 2 - promethazine (PHENERGAN) 12.5 MG tablet; Take 1 tablet (12.5 mg total) by mouth every 6 (six) hours as needed for nausea or vomiting.  Dispense: 30 tablet; Refill: 2  3. Encounter for supervision of low-risk pregnancy, antepartum - Prenatal Vit-Fe Fumarate-FA (PRENATAL PLUS VITAMIN/MINERAL) 27-1 MG TABS; Take 1 tablet by mouth daily.  Dispense: 30 tablet; Refill:  11    Please refer to After Visit Summary for other counseling recommendations.   No follow-ups on file.  Federico Flake, MD, MPH, ABFM Attending Physician Faculty Practice- [facility]

## 2023-09-14 ENCOUNTER — Telehealth: Payer: Medicaid Other | Admitting: General Practice

## 2023-09-14 ENCOUNTER — Encounter: Payer: Self-pay | Admitting: General Practice

## 2023-09-14 DIAGNOSIS — Z3400 Encounter for supervision of normal first pregnancy, unspecified trimester: Secondary | ICD-10-CM | POA: Insufficient documentation

## 2023-09-14 DIAGNOSIS — Z3401 Encounter for supervision of normal first pregnancy, first trimester: Secondary | ICD-10-CM

## 2023-09-14 HISTORY — DX: Encounter for supervision of normal first pregnancy, unspecified trimester: Z34.00

## 2023-09-14 MED ORDER — BLOOD PRESSURE KIT DEVI: 1.0000 | Freq: Once | 0 refills | Status: AC

## 2023-09-14 MED ORDER — GOJJI WEIGHT SCALE MISC: 1.0000 | Freq: Once | 0 refills | Status: AC

## 2023-09-14 NOTE — Progress Notes (Signed)
New OB Intake  I connected with Kimberly Wise  on 09/14/23 at 10:15 AM EDT by MyChart Video Visit and verified that I am speaking with the correct person using two identifiers. Nurse is located at [facility] and pt is located at home.  I discussed the limitations, risks, security and privacy concerns of performing an evaluation and management service by telephone and the availability of in person appointments. I also discussed with the patient that there may be a patient responsible charge related to this service. The patient expressed understanding and agreed to proceed.  I explained I am completing New OB Intake today. We discussed EDD of 04/11/2024, by Last Menstrual Period. Pt is G1P0. I reviewed her allergies, medications and Medical/Surgical/OB history.    Patient Active Problem List   Diagnosis Date Noted   Supervision of low-risk first pregnancy 09/14/2023    Concerns addressed today None   Delivery Plans Plans to deliver at [facility]. Discussed the nature of our practice with multiple providers including residents and students. Due to the size of the practice, the delivering provider may not be the same as those providing prenatal care.   Patient  is unsure about  water birth. Can discuss at upcoming OB visit with Dr Alvester Morin if desired.  MyChart/Babyscripts MyChart access verified. I explained pt will have some visits in office and some virtually. Babyscripts instructions given and order placed. Patient verifies receipt of registration text/e-mail. Account successfully created and app downloaded.  Blood Pressure Cuff/Weight Scale Blood pressure cuff ordered for patient to pick-up from [employer]. Explained after first prenatal appt pt will check weekly and document in Babyscripts. Patient does not have weight scale; order sent to [facility], patient may track weight weekly in Babyscripts.  Anatomy US Explained first scheduled [place] will be around 19 weeks. Anatomy US scheduled  for November 20 at 1215pm.  Is patient a [facility] candidate?  Declined due to Enrolled in [facility]  Is patient a [facility] candidate?  Accepted   If accepted, confirm patient does not intend to move from the area for at least 12 months, then notify [facility] staff  Interested in [place]? If yes, send referral and doula dot phrase.  Yes, will place referral  Is patient a candidate for Babyscripts Optimization? No - [facility]  First visit review I reviewed new OB appt with patient. Explained pt will be seen by Dr Alvester Morin at first visit. Discussed Avelina Laine genetic screening with patient. Patient would like Paramedic.. Routine prenatal labs  to be drawn at initial visit     Last Pap Pt is turning 22 next month- discussed pap may be offered during pregnancy or postpartum  [name], RN 09/14/2023  10:28 AM

## 2023-09-21 ENCOUNTER — Other Ambulatory Visit: Payer: Self-pay

## 2023-09-21 ENCOUNTER — Ambulatory Visit (INDEPENDENT_AMBULATORY_CARE_PROVIDER_SITE_OTHER): Payer: Medicaid Other | Admitting: Family Medicine

## 2023-09-21 ENCOUNTER — Other Ambulatory Visit (HOSPITAL_COMMUNITY)
Admission: RE | Admit: 2023-09-21 | Discharge: 2023-09-21 | Disposition: A | Discharge status: Home / Self Care | Payer: Medicaid Other | Source: Ambulatory Visit | Attending: Family Medicine | Admitting: Family Medicine

## 2023-09-21 VITALS — BP 113/73 | HR 128 | Wt 150.0 lb

## 2023-09-21 DIAGNOSIS — Z3A11 11 weeks gestation of pregnancy: Secondary | ICD-10-CM | POA: Diagnosis not present

## 2023-09-21 DIAGNOSIS — Z3401 Encounter for supervision of normal first pregnancy, first trimester: Secondary | ICD-10-CM

## 2023-09-21 DIAGNOSIS — O99011 Anemia complicating pregnancy, first trimester: Secondary | ICD-10-CM | POA: Diagnosis not present

## 2023-09-21 DIAGNOSIS — O219 Vomiting of pregnancy, unspecified: Secondary | ICD-10-CM | POA: Diagnosis not present

## 2023-09-21 MED ORDER — ASPIRIN 81 MG PO TBEC
81.0000 mg | DELAYED_RELEASE_TABLET | Freq: Every day | ORAL | 2 refills | Status: DC
Start: 2023-09-21 — End: 2024-03-23

## 2023-09-21 NOTE — Progress Notes (Unsigned)
INITIAL PRENATAL VISIT  Subjective:   Kimberly Wise is being seen today for her first obstetrical visit.  This is a desired pregnancy.  She is at [redacted]w[redacted]d gestation by LMP. Her obstetrical history is significant for  none . Relationship with FOB: significant other, living together. Patient does intend to breast feed. Pregnancy history fully reviewed.  Patient reports no complaints.  Indications for ASA therapy (per uptodate) One of the following: Previous pregnancy with preeclampsia, especially early onset and with an adverse outcome No Multifetal gestation No Chronic hypertension No Type 1 or 2 diabetes mellitus No Chronic kidney disease No Autoimmune disease (antiphospholipid syndrome, systemic lupus erythematosus) No  Two or more of the following: Nulliparity Yes Obesity (body mass index >30 kg/m2) No Family history of preeclampsia in mother or sister {yes/no:20286} Age >=35 years No Sociodemographic characteristics (African American race, low socioeconomic level) Yes Personal risk factors (eg, previous pregnancy with low birth weight or small for gestational age infant, previous adverse pregnancy outcome [eg, stillbirth], interval >10 years between pregnancies) No  Indications for early GDM screening  First-degree relative with diabetes Yes BMI >30kg/m2 No Age > 25 No Previous birth of an infant weighing >=4000 g No Gestational diabetes mellitus in a previous pregnancy No Glycated hemoglobin >=5.7 percent (39 mmol/mol), impaired glucose tolerance, or impaired fasting glucose on previous testing No High-risk race/ethnicity (eg, African American, Latino, Native American, Asian American, Pacific Islander) Yes Previous stillbirth of unknown cause No Maternal birthweight > 9 lbs No History of cardiovascular disease No Hypertension or on therapy for hypertension No High-density lipoprotein cholesterol level <35 mg/dL (5.36 mmol/L) and/or a triglyceride level >250 mg/dL (6.44  mmol/L) No Polycystic ovary syndrome No Physical inactivity No Other clinical condition associated with insulin resistance (eg, severe obesity, acanthosis nigricans) No Current use of glucocorticoids No   Early screening tests: FBS, A1C, Random CBG, glucose challenge   Review of Systems:   Review of Systems  Objective:    Obstetric History OB History  Gravida Para Term Preterm AB Living  1 0 0 0 0 0  SAB IAB Ectopic Multiple Live Births  0 0 0 0 0    # Outcome Date GA Lbr Len/2nd Weight Sex Type Anes PTL Lv  1 Current             Past Medical History:  Diagnosis Date  . Anemia   . Fibroadenoma of breast     Past Surgical History:  Procedure Laterality Date  . BREAST LUMPECTOMY WITH RADIOACTIVE SEED LOCALIZATION Right 09/02/2021   Procedure: RIGHT BREAST LUMPECTOMY WITH RADIOACTIVE SEED LOCALIZATION;  Surgeon: Harriette Bouillon, MD;  Location: [facility];  Service: General;  Laterality: Right;    Current Outpatient Medications on File Prior to Visit  Medication Sig Dispense Refill  . Doxylamine-Pyridoxine (DICLEGIS) 10-10 MG TBEC Take 2 tablets by mouth at bedtime. May add 1 tablet at breakfast and 1 tablet at lunch if needed. 100 tablet 2  . Prenatal Vit-Fe Fumarate-FA (PRENATAL PLUS VITAMIN/MINERAL) 27-1 MG TABS Take 1 tablet by mouth daily. 30 tablet 11   No current facility-administered medications on file prior to visit.    No Known Allergies  Social History:  reports that she has never smoked. She has never been exposed to tobacco smoke. She has never used smokeless tobacco. She reports that she does not currently use drugs after having used the following drugs: Marijuana. She reports that she does not drink alcohol.  Family History  Problem Relation Age  of Onset  . Cancer Maternal Grandmother     The following portions of the patient's history were reviewed and updated as appropriate: allergies, current medications, past family history, past  medical history, past social history, past surgical history and problem list.  Review of Systems Review of Systems  Constitutional:  Negative for chills and fever.  HENT:  Negative for congestion and sore throat.   Eyes:  Negative for pain and visual disturbance.  Respiratory:  Negative for cough, chest tightness and shortness of breath.   Cardiovascular:  Negative for chest pain.  Gastrointestinal:  Positive for nausea. Negative for abdominal pain, diarrhea and vomiting.  Endocrine: Negative for cold intolerance and heat intolerance.  Genitourinary:  Negative for dysuria and flank pain.  Musculoskeletal:  Negative for back pain.  Skin:  Negative for rash.  Allergic/Immunologic: Negative for food allergies.  Neurological:  Negative for dizziness and light-headedness.  Psychiatric/Behavioral:  Negative for agitation.       Physical Exam:  BP 113/73   Pulse (!) 128   Wt 150 lb (68 kg)   LMP 07/06/2023 (Exact Date)   BMI 24.21 kg/m  CONSTITUTIONAL: Well-developed, well-nourished female in no acute distress.  HENT:  Normocephalic, atraumatic.  Oropharynx is clear and moist EYES: Conjunctivae normal. No scleral icterus.  NECK: Normal range of motion, supple, no masses.  Normal thyroid.  SKIN: Skin is warm and dry. No rash noted. Not diaphoretic. No erythema. No pallor. MUSCULOSKELETAL: Normal range of motion. No tenderness.  No cyanosis, clubbing, or edema.   NEUROLOGIC: Alert and oriented to person, place, and time. Normal muscle tone coordination.  PSYCHIATRIC: Normal mood and affect. Normal behavior. Normal judgment and thought content. CARDIOVASCULAR: Normal heart rate noted, regular rhythm RESPIRATORY: Clear to auscultation bilaterally. Effort and breath sounds normal, no problems with respiration noted. BREASTS: Symmetric in size. No masses, skin changes, nipple drainage, or lymphadenopathy. ABDOMEN: Soft, normal bowel sounds, no distention noted.  No tenderness, rebound or  guarding. Fundal ht: *** PELVIC: deferred  Fetal Heart Rate (bpm): 165   Movement: Absent       Assessment:    Pregnancy: G1P0000  1. Encounter for supervision of low-risk first pregnancy in first trimester - CBC/D/Plt+RPR+Rh+ABO+RubIgG... - GC/Chlamydia probe amp ([place])not at [facility] - Culture, OB Urine - Hemoglobin A1c - Flu vaccine trivalent PF, 6mos and older(Flulaval,Afluria,Fluarix,Fluzone) - PANORAMA PRENATAL TEST - HORIZON Basic Panel     Plan:     Initial labs drawn. Prenatal vitamins. Problem list reviewed and updated. Reviewed in detail the nature of the practice with collaborative care between  Genetic screening discussed: NIPS/First trimester screen/Quad/AFP {requests/ordered/declines:14581}. Role of ultrasound in pregnancy discussed; Anatomy US: {requests/ordered/declines:14581}. Amniocentesis discussed: {amniocentesis:14582}. Follow up in {numbers 0-4:31231} weeks. Discussed clinic routines, schedule of care and testing, genetic screening options, involvement of students and residents under the direct supervision of APPs and doctors and presence of female providers. Pt verbalized understanding.   Federico Flake, MD 09/21/2023 3:57 PM

## 2023-09-21 NOTE — Patient Instructions (Signed)
Considering Waterbirth? Guide for patients at [facility]) Why consider waterbirth? Gentle birth for babies  Less pain medicine used in labor  May allow for passive descent/less pushing  May reduce perineal tears  More mobility and instinctive maternal position changes  Increased maternal relaxation   Is waterbirth safe? What are the risks of infection, drowning or other complications? Infection:  Very low risk (3.7 % for tub vs 4.8% for bed)  7 in 8000 waterbirths with documented infection  Poorly cleaned equipment most common cause  Slightly lower group B strep transmission rate  Drowning  Maternal:  Very low risk  Related to seizures or fainting  Newborn:  Very low risk. No evidence of increased risk of respiratory problems in multiple large studies  Physiological protection from breathing under water  Avoid underwater birth if there are any fetal complications  Once baby's head is out of the water, keep it out.  Birth complication  Some reports of cord trauma, but risk decreased by bringing baby to surface gradually  No evidence of increased risk of shoulder dystocia. Mothers can usually change positions faster in water than in a bed, possibly aiding the maneuvers to free the shoulder.   There are 2 things you MUST do to have a waterbirth with [facility]: Attend a waterbirth class at [facility] at [facility]   3rd Wednesday of every month from 7-9 pm (virtual during COVID) [employer] at [email or web address] or [email or web address] or by calling [phone] Bring Korea the certificate from the class to your prenatal appointment or send via MyChart Meet with a midwife at 36 weeks* to see if you can still plan a waterbirth and to sign the consent.   *We also recommend that you schedule as many of your prenatal visits with a midwife as possible.    Helpful information: You may want to bring a bathing suit top to the hospital  to wear during labor but this is optional.  All other supplies are provided by the hospital. Please arrive at the hospital with signs of active labor, and do not wait at home until late in labor. It takes 45 min- 1 hour for fetal monitoring, and check in to your room to take place, plus transport and filling of the waterbirth tub.    Things that would prevent you from having a waterbirth: Premature, <37wks  Previous cesarean birth  Presence of thick meconium-stained fluid  Multiple gestation (Twins, triplets, etc.)  Uncontrolled diabetes or gestational diabetes requiring medication  Hypertension diagnosed in pregnancy or preexisting hypertension (gestational hypertension, preeclampsia, or chronic hypertension) Fetal growth restriction (your baby measures less than 10th percentile on ultrasound) Heavy vaginal bleeding  Non-reassuring fetal heart rate  Active infection (MRSA, etc.). Group B Strep is NOT a contraindication for waterbirth.  If your labor has to be induced and induction method requires continuous monitoring of the baby's heart rate  Other risks/issues identified by your obstetrical provider   Please remember that birth is unpredictable. Under certain unforeseeable circumstances your provider may advise against giving birth in the tub. These decisions will be made on a case-by-case basis and with the safety of you and your baby as our highest priority.    Updated 03/31/22  Aspirin 81 mg po daily ordered for preeclampsia prevention

## 2023-09-22 ENCOUNTER — Encounter: Payer: Self-pay | Admitting: *Deleted

## 2023-09-22 DIAGNOSIS — O219 Vomiting of pregnancy, unspecified: Secondary | ICD-10-CM | POA: Insufficient documentation

## 2023-09-22 DIAGNOSIS — O99011 Anemia complicating pregnancy, first trimester: Secondary | ICD-10-CM | POA: Insufficient documentation

## 2023-09-22 LAB — CBC/D/PLT+RPR+RH+ABO+RUBIGG...
Antibody Screen: NEGATIVE
Basophils Absolute: 0 10*3/uL (ref 0.0–0.2)
Basos: 0 %
EOS (ABSOLUTE): 0.1 10*3/uL (ref 0.0–0.4)
Eos: 1 %
HCV Ab: NONREACTIVE
HIV Screen 4th Generation wRfx: NONREACTIVE
Hematocrit: 33.9 % — ABNORMAL LOW (ref 34.0–46.6)
Hemoglobin: 10.4 g/dL — ABNORMAL LOW (ref 11.1–15.9)
Hepatitis B Surface Ag: NEGATIVE
Immature Grans (Abs): 0 10*3/uL (ref 0.0–0.1)
Immature Granulocytes: 0 %
Lymphocytes Absolute: 1.3 10*3/uL (ref 0.7–3.1)
Lymphs: 13 %
MCH: 24.5 pg — ABNORMAL LOW (ref 26.6–33.0)
MCHC: 30.7 g/dL — ABNORMAL LOW (ref 31.5–35.7)
MCV: 80 fL (ref 79–97)
Monocytes Absolute: 0.9 10*3/uL (ref 0.1–0.9)
Monocytes: 9 %
Neutrophils Absolute: 7.4 10*3/uL — ABNORMAL HIGH (ref 1.4–7.0)
Neutrophils: 77 %
Platelets: 312 10*3/uL (ref 150–450)
RBC: 4.25 x10E6/uL (ref 3.77–5.28)
RDW: 18.7 % — ABNORMAL HIGH (ref 11.7–15.4)
RPR Ser Ql: NONREACTIVE
Rh Factor: POSITIVE
Rubella Antibodies, IGG: 7.75 index (ref >0.99)
WBC: 9.6 10*3/uL (ref 3.4–10.8)

## 2023-09-22 LAB — HCV INTERPRETATION

## 2023-09-22 LAB — GC/CHLAMYDIA PROBE AMP (~~LOC~~) NOT AT ARMC
Chlamydia: NEGATIVE
Comment: NEGATIVE
Comment: NORMAL
Neisseria Gonorrhea: NEGATIVE

## 2023-09-22 LAB — HEMOGLOBIN A1C
Est. average glucose Bld gHb Est-mCnc: 108 mg/dL
Hgb A1c MFr Bld: 5.4 % (ref 4.8–5.6)

## 2023-09-22 MED ORDER — FERROUS SULFATE 325 (65 FE) MG PO TBEC: 325.0000 mg | DELAYED_RELEASE_TABLET | ORAL | 1 refills | Status: DC

## 2023-09-23 LAB — CULTURE, OB URINE

## 2023-09-23 LAB — URINE CULTURE, OB REFLEX

## 2023-09-29 LAB — PANORAMA PRENATAL TEST FULL PANEL:PANORAMA TEST PLUS 5 ADDITIONAL MICRODELETIONS: FETAL FRACTION: 13.7

## 2023-10-02 LAB — HORIZON CUSTOM: REPORT SUMMARY: NEGATIVE

## 2023-10-19 ENCOUNTER — Ambulatory Visit: Payer: Medicaid Other | Admitting: Family Medicine

## 2023-10-19 ENCOUNTER — Other Ambulatory Visit: Payer: Self-pay

## 2023-10-19 VITALS — BP 107/72 | HR 86 | Wt 148.4 lb

## 2023-10-19 DIAGNOSIS — Z23 Encounter for immunization: Secondary | ICD-10-CM | POA: Diagnosis not present

## 2023-10-19 DIAGNOSIS — Z3402 Encounter for supervision of normal first pregnancy, second trimester: Secondary | ICD-10-CM

## 2023-10-19 DIAGNOSIS — D649 Anemia, unspecified: Secondary | ICD-10-CM

## 2023-10-19 DIAGNOSIS — Z3A15 15 weeks gestation of pregnancy: Secondary | ICD-10-CM | POA: Diagnosis not present

## 2023-10-19 DIAGNOSIS — O219 Vomiting of pregnancy, unspecified: Secondary | ICD-10-CM

## 2023-10-19 DIAGNOSIS — O99011 Anemia complicating pregnancy, first trimester: Secondary | ICD-10-CM | POA: Diagnosis not present

## 2023-10-19 MED ORDER — FERRIC MALTOL 30 MG PO CAPS
1.0000 | ORAL_CAPSULE | Freq: Two times a day (BID) | ORAL | 11 refills | Status: DC
Start: 2023-10-19 — End: 2023-11-22

## 2023-10-19 NOTE — Patient Instructions (Signed)

## 2023-10-19 NOTE — Progress Notes (Signed)
   PRENATAL VISIT NOTE  Subjective:  Kimberly Wise is a 22 y.o. G1P0000 at [redacted]w[redacted]d being seen today for ongoing prenatal care.  She is currently monitored for the following issues for this low-risk pregnancy and has Supervision of low-risk first pregnancy; Anemia complicating pregnancy, first trimester; and Nausea and vomiting during pregnancy on their problem list.  Patient reports no complaints.  Contractions: Not present. Vag. Bleeding: None.  Movement: Absent. Denies leaking of fluid.   The following portions of the patient's history were reviewed and updated as appropriate: allergies, current medications, past family history, past medical history, past social history, past surgical history and problem list.   Objective:   Vitals:   10/19/23 1119  BP: 107/72  Pulse: 86  Weight: 148 lb 6.4 oz (67.3 kg)    Fetal Status: Fetal Heart Rate (bpm): 150   Movement: Absent     General:  Alert, oriented and cooperative. Patient is in no acute distress.  Skin: Skin is warm and dry. No rash noted.   Cardiovascular: Normal heart rate noted  Respiratory: Normal respiratory effort, no problems with respiration noted  Abdomen: Soft, gravid, appropriate for gestational age.  Pain/Pressure: Present     Pelvic: Cervical exam deferred        Extremities: Normal range of motion.  Edema: None  Mental Status: Normal mood and affect. Normal behavior. Normal judgment and thought content.   Assessment and Plan:  Pregnancy: G1P0000 at [redacted]w[redacted]d 1. Anemia complicating pregnancy, first trimester Lab Results  Component Value Date   HGB 10.4 (L) 09/21/2023   Unable to get ferrous sulfate Recheck POC hgb next visit  2. Encounter for supervision of low-risk first pregnancy in second trimester Up to date  No complaints Feeling flutters Concerns today: unable to get ASA and iron Has anatomy US scheduled  3. Nausea and vomiting during pregnancy Improved  Preterm labor symptoms and general obstetric  precautions including but not limited to vaginal bleeding, contractions, leaking of fluid and fetal movement were reviewed in detail with the patient. Please refer to After Visit Summary for other counseling recommendations.   Return in about 4 weeks (around 11/16/2023) for Mom+Baby Combined Care.  Future Appointments  Date Time Provider Department Center  11/16/2023 12:15 PM A Rosie Place NURSE [facility]  11/16/2023 12:30 PM [facility] US1 [facility]  11/22/2023 10:35 AM Crissie Reese, Mary Sella, MD [facility]    Federico Flake, MD

## 2023-10-21 LAB — AFP, SERUM, OPEN SPINA BIFIDA
AFP MoM: 1.07
AFP Value: 34.4 ng/mL
Gest. Age on Collection Date: 15 wk
Maternal Age At EDD: 21.5 a
OSBR Risk 1 IN: 10000
Test Results:: NEGATIVE
Weight: 148 [lb_av]

## 2023-11-16 ENCOUNTER — Other Ambulatory Visit: Payer: Self-pay | Admitting: Family Medicine

## 2023-11-16 ENCOUNTER — Ambulatory Visit: Payer: Medicaid Other

## 2023-11-16 ENCOUNTER — Other Ambulatory Visit: Payer: Self-pay

## 2023-11-16 ENCOUNTER — Other Ambulatory Visit: Payer: Self-pay | Admitting: *Deleted

## 2023-11-16 ENCOUNTER — Ambulatory Visit: Payer: Medicaid Other | Attending: Family Medicine

## 2023-11-16 DIAGNOSIS — Z3401 Encounter for supervision of normal first pregnancy, first trimester: Secondary | ICD-10-CM

## 2023-11-16 DIAGNOSIS — O358XX0 Maternal care for other (suspected) fetal abnormality and damage, not applicable or unspecified: Secondary | ICD-10-CM | POA: Diagnosis not present

## 2023-11-16 DIAGNOSIS — O26872 Cervical shortening, second trimester: Secondary | ICD-10-CM | POA: Insufficient documentation

## 2023-11-16 DIAGNOSIS — O99012 Anemia complicating pregnancy, second trimester: Secondary | ICD-10-CM | POA: Insufficient documentation

## 2023-11-16 DIAGNOSIS — Z363 Encounter for antenatal screening for malformations: Secondary | ICD-10-CM | POA: Diagnosis not present

## 2023-11-16 DIAGNOSIS — Z362 Encounter for other antenatal screening follow-up: Secondary | ICD-10-CM

## 2023-11-16 DIAGNOSIS — Z3A19 19 weeks gestation of pregnancy: Secondary | ICD-10-CM | POA: Diagnosis not present

## 2023-11-17 ENCOUNTER — Encounter: Payer: Self-pay | Admitting: *Deleted

## 2023-11-17 DIAGNOSIS — O283 Abnormal ultrasonic finding on antenatal screening of mother: Secondary | ICD-10-CM | POA: Insufficient documentation

## 2023-11-17 DIAGNOSIS — O26879 Cervical shortening, unspecified trimester: Secondary | ICD-10-CM | POA: Insufficient documentation

## 2023-11-22 ENCOUNTER — Other Ambulatory Visit: Payer: Self-pay

## 2023-11-22 ENCOUNTER — Ambulatory Visit: Payer: Medicaid Other | Admitting: Family Medicine

## 2023-11-22 ENCOUNTER — Encounter: Payer: Self-pay | Admitting: Family Medicine

## 2023-11-22 VITALS — BP 114/85 | HR 86 | Wt 159.0 lb

## 2023-11-22 DIAGNOSIS — O99112 Other diseases of the blood and blood-forming organs and certain disorders involving the immune mechanism complicating pregnancy, second trimester: Secondary | ICD-10-CM

## 2023-11-22 DIAGNOSIS — O283 Abnormal ultrasonic finding on antenatal screening of mother: Secondary | ICD-10-CM

## 2023-11-22 DIAGNOSIS — O99011 Anemia complicating pregnancy, first trimester: Secondary | ICD-10-CM

## 2023-11-22 DIAGNOSIS — O26872 Cervical shortening, second trimester: Secondary | ICD-10-CM

## 2023-11-22 DIAGNOSIS — Z3A19 19 weeks gestation of pregnancy: Secondary | ICD-10-CM

## 2023-11-22 DIAGNOSIS — Z3402 Encounter for supervision of normal first pregnancy, second trimester: Secondary | ICD-10-CM

## 2023-11-22 MED ORDER — FERROUS SULFATE 325 (65 FE) MG PO TBEC: 325.0000 mg | DELAYED_RELEASE_TABLET | ORAL | 2 refills | Status: AC

## 2023-11-22 NOTE — Patient Instructions (Signed)

## 2023-11-22 NOTE — Progress Notes (Signed)
   Subjective:  Kimberly Wise is a 22 y.o. G1P0000 at [redacted]w[redacted]d being seen today for ongoing prenatal care.  She is currently monitored for the following issues for this high-risk pregnancy and has Supervision of low-risk first pregnancy; Anemia complicating pregnancy, first trimester; Nausea and vomiting during pregnancy; Short cervical length during pregnancy; and Echogenic intracardiac focus of fetus on prenatal ultrasound on their problem list.  Patient reports no complaints.  Contractions: Not present. Vag. Bleeding: None.  Movement: Present. Denies leaking of fluid.   The following portions of the patient's history were reviewed and updated as appropriate: allergies, current medications, past family history, past medical history, past social history, past surgical history and problem list. Problem list updated.  Objective:   Vitals:   11/22/23 1102  BP: 114/85  Pulse: 86  Weight: 159 lb (72.1 kg)    Fetal Status: Fetal Heart Rate (bpm): 156   Movement: Present     General:  Alert, oriented and cooperative. Patient is in no acute distress.  Skin: Skin is warm and dry. No rash noted.   Cardiovascular: Normal heart rate noted  Respiratory: Normal respiratory effort, no problems with respiration noted  Abdomen: Soft, gravid, appropriate for gestational age. Pain/Pressure: Absent     Pelvic: Vag. Bleeding: None     Cervical exam deferred        Extremities: Normal range of motion.  Edema: None  Mental Status: Normal mood and affect. Normal behavior. Normal judgment and thought content.   Urinalysis:      Assessment and Plan:  Pregnancy: G1P0000 at [redacted]w[redacted]d  1. Encounter for supervision of low-risk first pregnancy in second trimester BP and FHR normal Discussed AFP, collected today  2. Short cervical length during pregnancy in second trimester Borderline normal at 2.5 cm on anatomy scan, has follow up scheduled with MFM Discussed this finding, answered all questions  3. Echogenic  intracardiac focus of fetus on prenatal ultrasound Normal Panorama  4. Anemia complicating pregnancy, first trimester Outside check hgb was 9.8, start PO iron every other day  Preterm labor symptoms and general obstetric precautions including but not limited to vaginal bleeding, contractions, leaking of fluid and fetal movement were reviewed in detail with the patient. Please refer to After Visit Summary for other counseling recommendations.  Return in 4 weeks (on 12/20/2023) for [place] patient, ob visit.   Venora Maples, MD

## 2023-11-25 LAB — AFP, SERUM, OPEN SPINA BIFIDA
AFP MoM: 0.76
AFP Value: 42.8 ng/mL
Gest. Age on Collection Date: 19.6 wk
Maternal Age At EDD: 21.5 a
OSBR Risk 1 IN: 10000
Test Results:: NEGATIVE
Weight: 159 [lb_av]

## 2023-11-30 ENCOUNTER — Ambulatory Visit: Payer: Medicaid Other | Attending: Obstetrics and Gynecology

## 2023-11-30 ENCOUNTER — Other Ambulatory Visit: Payer: Self-pay

## 2023-11-30 DIAGNOSIS — O99012 Anemia complicating pregnancy, second trimester: Secondary | ICD-10-CM

## 2023-11-30 DIAGNOSIS — Z362 Encounter for other antenatal screening follow-up: Secondary | ICD-10-CM | POA: Diagnosis present

## 2023-11-30 DIAGNOSIS — O35BXX0 Maternal care for other (suspected) fetal abnormality and damage, fetal cardiac anomalies, not applicable or unspecified: Secondary | ICD-10-CM | POA: Diagnosis not present

## 2023-11-30 DIAGNOSIS — Z3A21 21 weeks gestation of pregnancy: Secondary | ICD-10-CM

## 2023-11-30 DIAGNOSIS — O26872 Cervical shortening, second trimester: Secondary | ICD-10-CM | POA: Insufficient documentation

## 2023-11-30 DIAGNOSIS — D649 Anemia, unspecified: Secondary | ICD-10-CM | POA: Diagnosis not present

## 2023-12-15 ENCOUNTER — Ambulatory Visit: Payer: Medicaid Other | Attending: Obstetrics and Gynecology

## 2023-12-15 ENCOUNTER — Other Ambulatory Visit: Payer: Self-pay

## 2023-12-15 DIAGNOSIS — O35BXX0 Maternal care for other (suspected) fetal abnormality and damage, fetal cardiac anomalies, not applicable or unspecified: Secondary | ICD-10-CM

## 2023-12-15 DIAGNOSIS — Z362 Encounter for other antenatal screening follow-up: Secondary | ICD-10-CM | POA: Insufficient documentation

## 2023-12-15 DIAGNOSIS — Z363 Encounter for antenatal screening for malformations: Secondary | ICD-10-CM | POA: Diagnosis present

## 2023-12-15 DIAGNOSIS — O26872 Cervical shortening, second trimester: Secondary | ICD-10-CM | POA: Insufficient documentation

## 2023-12-15 DIAGNOSIS — D649 Anemia, unspecified: Secondary | ICD-10-CM | POA: Diagnosis not present

## 2023-12-15 DIAGNOSIS — O99012 Anemia complicating pregnancy, second trimester: Secondary | ICD-10-CM

## 2023-12-15 DIAGNOSIS — Z3A23 23 weeks gestation of pregnancy: Secondary | ICD-10-CM

## 2023-12-19 ENCOUNTER — Encounter: Payer: Medicaid Other | Admitting: Family Medicine

## 2023-12-26 ENCOUNTER — Ambulatory Visit (INDEPENDENT_AMBULATORY_CARE_PROVIDER_SITE_OTHER): Payer: Medicaid Other | Admitting: Family Medicine

## 2023-12-26 ENCOUNTER — Other Ambulatory Visit: Payer: Self-pay

## 2023-12-26 VITALS — BP 114/74 | HR 89 | Wt 164.6 lb

## 2023-12-26 DIAGNOSIS — O26872 Cervical shortening, second trimester: Secondary | ICD-10-CM

## 2023-12-26 DIAGNOSIS — O219 Vomiting of pregnancy, unspecified: Secondary | ICD-10-CM

## 2023-12-26 DIAGNOSIS — O99011 Anemia complicating pregnancy, first trimester: Secondary | ICD-10-CM

## 2023-12-26 DIAGNOSIS — Z3402 Encounter for supervision of normal first pregnancy, second trimester: Secondary | ICD-10-CM

## 2023-12-26 DIAGNOSIS — Z3A24 24 weeks gestation of pregnancy: Secondary | ICD-10-CM

## 2023-12-26 DIAGNOSIS — D649 Anemia, unspecified: Secondary | ICD-10-CM

## 2023-12-26 NOTE — Addendum Note (Signed)
Addended by: Cinda Quest A on: 12/26/2023 05:08 PM   Modules accepted: Orders

## 2023-12-26 NOTE — Patient Instructions (Signed)
Options for Doula Care in the [place]  As you review your birthing options, consider having a birth doula. A doula is trained to provide support before, during and just after you give birth. There are also postpartum doulas that help you adjust to new parenthood.  While doulas do not provide medical care, they do provide emotional, physical and educational support. A few months before your baby arrives, doulas can help answer questions, ease concerns and help you create and support your birthing plan.    Doulas can help reduce your stress and comfort you and your partner. They can help you cope with labor by helping you use breathing techniques, massage, creative labor positioning, essential oils and affirmations.   Studies show that the benefits of having a doula include:   A more positive birth experience  Fewer requests for pain-relief medication  Less likelihood of cesarean section, commonly called a c-section   Doulas are typically hired via a Advertising account planner between you and the doula. We are happy to provide a list of the most active doulas in the area, all of whom are credentialed by [facility] and will not count as a visitor at your birth.  There are several options for no-cost doula care at our hospital, including:  [facility] Volunteer Doula Program Every [employer] Program A Cure 4 Moms Doula Study (available only at [employer] for Women, [place], [place] and [employer] [facility] offices)  For more information on these programs or to receive a list of doulas active in our area, please email [email or web address]      Considering [place]? Guide for patients at Center for [employer] [facility]) Why consider waterbirth? Gentle birth for babies  Less pain medicine used in labor  May allow for passive descent/less pushing  May reduce perineal tears  More mobility and instinctive maternal position changes  Increased maternal relaxation   Is waterbirth safe? What are  the risks of infection, drowning or other complications? Infection:  Very low risk (3.7 % for tub vs 4.8% for bed)  7 in 8000 waterbirths with documented infection  Poorly cleaned equipment most common cause  Slightly lower group B strep transmission rate  Drowning  Maternal:  Very low risk  Related to seizures or fainting  Newborn:  Very low risk. No evidence of increased risk of respiratory problems in multiple large studies  Physiological protection from breathing under water  Avoid underwater birth if there are any fetal complications  Once baby's head is out of the water, keep it out.  Birth complication  Some reports of cord trauma, but risk decreased by bringing baby to surface gradually  No evidence of increased risk of shoulder dystocia. Mothers can usually change positions faster in water than in a bed, possibly aiding the maneuvers to free the shoulder.   There are 2 things you MUST do to have a waterbirth with [facility]: Attend a waterbirth class at [employer] [facility] at [facility]   3rd Wednesday of every month from 7-9 pm (virtual during COVID) [employer] at [email or web address] or [email or web address] or by calling [phone] Bring [place] the certificate from the class to your prenatal appointment or send via MyChart Meet with a midwife at 36 weeks* to see if you can still plan a waterbirth and to sign the consent.   *We also recommend that you schedule as many of your prenatal visits with a midwife as possible.    Helpful information: You may want to bring a bathing  suit top to the hospital to wear during labor but this is optional.  All other supplies are provided by the hospital. Please arrive at the hospital with signs of active labor, and do not wait at home until late in labor. It takes 45 min- 1 hour for fetal monitoring, and check in to your room to take place, plus transport and filling of the waterbirth tub.    Things that would prevent  you from having a waterbirth: Premature, <37wks  Previous cesarean birth  Presence of thick meconium-stained fluid  Multiple gestation (Twins, triplets, etc.)  Uncontrolled diabetes or gestational diabetes requiring medication  Hypertension diagnosed in pregnancy or preexisting hypertension (gestational hypertension, preeclampsia, or chronic hypertension) Fetal growth restriction (your baby measures less than 10th percentile on ultrasound) Heavy vaginal bleeding  Non-reassuring fetal heart rate  Active infection (MRSA, etc.). Group B Strep is NOT a contraindication for waterbirth.  If your labor has to be induced and induction method requires continuous monitoring of the baby's heart rate  Other risks/issues identified by your obstetrical provider   Please remember that birth is unpredictable. Under certain unforeseeable circumstances your provider may advise against giving birth in the tub. These decisions will be made on a case-by-case basis and with the safety of you and your baby as our highest priority.    Updated 03/31/22   We highly recommend childbirth education to help you plan for labor and begin practicing coping skills (which will be needed with or without pain meds).  [place] Childbirth Education Options: Sign up by visiting [email or web address]  Childbirth ~ Self-Paced eClass (English and Spanish) This online class offers you the freedom to complete a childbirth education series in the comfort of your own home at your own pace.  Childbirth Class (In-Person 4-Week Series  or on Saturdays, Virtual 4-Week Series ~ [place]) This interactive in-person class series will help you and your partner prepare for your birth experience. Topics include: Labor & Birth, Comfort Measures, Breathing Techniques, Massage, Medical Interventions, Pain Management Options, Cesarean Birth, Postpartum Care, and Newborn Care  Comfort Techniques for Labor ~ In-Person Class [facility]) This  interactive class is designed for parents-to-be who want to learn & practice hands-on skills to help relieve some of the discomfort of labor and encourage their babies to rotate toward the best position for birth. Moms and their partners will be able to try a variety of labor positions with birth balls and rebozos as well as practice breathing, relaxation, and visualization techniques.  Natural Childbirth Class (In-Person 5-Week Series, In-Person on Saturdays or Virtual 5-Week Series ~ [place]) This class series is designed for expectant parents who want to learn and practice natural methods of coping with the process of labor and childbirth.  Cesarean Birth Self-Paced eClass (English and Spanish) This online course provides comprehensive information you can trust as you prepare for a possible cesarean birth. In this class, you'll learn how to make your birth and recovery comfortable and joyful through instructive video clips, animations, and activities.  Waterbirth ~ Airline pilot Interested in a waterbirth? In addition to a consultation with your credentialed waterbirth provider, this free, informational online class will help you discover whether waterbirth is the right fit for you. Not all obstetrical practices offer waterbirth, so check with your healthcare provider.  Tour Probation officer) - [facility] [employer] our 4 minute video tour of [facility] located in [place].   [place] Parenting Education Options:  Pregnancy 101 (  Virtual) Congratulations on your pregnancy! This class is geared toward moms in their first trimester, but everyone is welcome. We are excited to guide you through all aspects of supporting a healthy pregnancy. You will learn what to expect at routine prenatal care appointments, common postpartum adjustments, basic infant safety, and breastfeeding.  Successful Partnering & Parenting ~ In-Person Workshop  [facility]) This workshop inspires and equips partners of all economic levels, ages, and cultures to confidently care for their infants, support the birthing persons, and navigate their own transformations into new partners and parents. Learning activities are geared towards supporting partner, but moms are welcome to attend.  'Baby & Me' Parenting Group (Virtual on Wednesdays at 11am) Enjoy this time discussing newborn & infant parenting topics and family adjustment issues with other new parents in a relaxed environment. Each week brings a new speaker or baby-centered activity. This group offers support and connection to parents as they journey through the adjustments and struggles of that sometimes overwhelming first year after the birth of a child.  Baby Safety, CPR, & Choking Class ~ Virtual This life-saving information is meant to encourage parents as they learn important safety and prevention tips as well as infant CPR and relief of choking.  Breastfeeding Class (In-Person in [place] or [employer]) Families learn what to expect in the first days and weeks of breastfeeding your newborn. IF YOU ARE AN EMPLOYEE TAKING THIS CLASS FOR CREDIT, DO NOT register yourself. Please e-mail [email or web address].   Breastfeeding Self-Paced eClass (English & Spanish) Families learn what to expect in the first days and weeks of breastfeeding your newborn.  Caring for Baby ~ In-Person, Virtual or Self-Paced Class This in-person class is for both expectant and adoptive parents who want to learn and practice the most up-to-date newborn care for their babies. Focus is on birth through the first six weeks of life.  CPR & Choking Relief for Infants & Children ~ In-Person Class [facility]) This in-person course is designed for any parent, expectant parent, or adult who cares for infants or children. Participants learn and demonstrate cardiopulmonary resuscitation and choking relief procedures for both  infants and children.  Grandparent Love ~ In-Person Class Grandparents will learn the most updated infant care and safety recommendations. They will discover ways to support their own children during the transition into the parenting role and receive tips on communicating with the new parents.  [place] Parenting Support Group Options:  Bereavement Grief Support Group (Pregnancy/Infant Loss) - Virtual This is an ongoing experience that meets once a month and is designed to help you honor the past, assist you in discovering tools to strengthen you today, and aid you in developing hope for the future.  Breastfeeding & Pumping Support Group (In-Person on Thursdays at 12pm or Virtual on Tuesdays at 5pm) Join [place] in-person each Thursday starting June 1st, 2023 at 12pm! This support group is free for all families looking for breastfeeding and/or pumping support.   Community-Based Childbirth Education Options:  [facility] Department Classes:  Childbirth education classes can help you get ready for a positive parenting experience. You can also meet other expectant parents and get free stuff for your baby. Each class runs for five weeks on the same night and costs $45 for the mother-to-be and her support person. Medicaid covers the cost if you are eligible. Call [phone] to register.  [facility] [employer] offers a variety of programs for the [employer] and is another great way to get connected. Please go to  [email or web address] for more information.  Childbirth With A Twist! Be informed of your options, get educated on birth, understand what your body is doing, learn how to cope, and have a lot of fun and laughs all while doing it either from the comfort of your couch OR in our cozy office and classroom space near the [place] airport. If you are taking a virtual class, then class is taught LIVE, so you can ask questions and receive answers in real-time from  an experienced doula and childbirth educator.  This virtual childbirth education class will meet for five instruction times online.  Although we are based in [place], [place], this virtual class is open to anyone in the world. Please visit: [email or web address] for more information.  Books We Love: The Doula Guide to Childbirth by Harland German and Otila Back The First-Time Parent's Childbirth Handbook by Dr. Amie Critchley, CNM The Birth Partner by Truddie Crumble

## 2023-12-26 NOTE — Progress Notes (Signed)
   PRENATAL VISIT NOTE  Subjective:  Kimberly Wise is a 22 y.o. G1P0000 at [redacted]w[redacted]d being seen today for ongoing prenatal care.  She is currently monitored for the following issues for this low-risk pregnancy and has Supervision of low-risk first pregnancy; Anemia complicating pregnancy, first trimester; Nausea and vomiting during pregnancy; Short cervical length during pregnancy; and Echogenic intracardiac focus of fetus on prenatal ultrasound on their problem list.  Patient reports no complaints.  Contractions: Not present. Vag. Bleeding: None.  Movement: Present. Denies leaking of fluid.   The following portions of the patient's history were reviewed and updated as appropriate: allergies, current medications, past family history, past medical history, past social history, past surgical history and problem list.   Objective:   Vitals:   12/26/23 1559  BP: 114/74  Pulse: 89  Weight: 164 lb 9.6 oz (74.7 kg)    Fetal Status: Fetal Heart Rate (bpm): 152   Movement: Present     General:  Alert, oriented and cooperative. Patient is in no acute distress.  Skin: Skin is warm and dry. No rash noted.   Cardiovascular: Normal heart rate noted  Respiratory: Normal respiratory effort, no problems with respiration noted  Abdomen: Soft, gravid, appropriate for gestational age.  Pain/Pressure: Absent     Pelvic: Cervical exam deferred        Extremities: Normal range of motion.  Edema: None  Mental Status: Normal mood and affect. Normal behavior. Normal judgment and thought content.   Assessment and Plan:  Pregnancy: G1P0000 at [redacted]w[redacted]d 1. Nausea and vomiting during pregnancy Improved  2. Anemia complicating pregnancy, first trimester Lab Results  Component Value Date   HGB 10.4 (L) 09/21/2023   HGB 10.4 (L) 09/01/2021  Outside of office Fe was 9.8 per report at last visit On oral iron  POC today= 8.4 Confirmation Anemia B profile ordered  3. Short cervical length during pregnancy in second  trimester 12/19  3.5cm, no follow up needed  4. Encounter for supervision of low-risk first pregnancy in second trimester (Primary) Doing well No concerns today FH appropriate  - Pt is interested in waterbirth.  No contraindications at this time per chart review/patient assessment.   - Pt to enroll in class, see CNMs for most visits in the office.  - Discussed waterbirth as option for low-risk pregnancy.  Reviewed conditions that may arise during pregnancy that will risk pt out of waterbirth including hypertension, diabetes, fetal growth restriction <10%ile, etc.  5. [redacted] weeks gestation of pregnancy   Preterm labor symptoms and general obstetric precautions including but not limited to vaginal bleeding, contractions, leaking of fluid and fetal movement were reviewed in detail with the patient. Please refer to After Visit Summary for other counseling recommendations.   Return in about 2 weeks (around 01/09/2024) for Routine prenatal care, Mom+Baby Combined Care.  Future Appointments  Date Time Provider Department Center  01/10/2024  8:20 AM [facility] LAB [facility]  01/10/2024  8:55 AM Venora Maples, MD [facility]  01/24/2024 10:15 AM Sue Lush, FNP [facility]    Federico Flake, MD

## 2023-12-27 ENCOUNTER — Other Ambulatory Visit: Payer: Medicaid Other

## 2023-12-27 DIAGNOSIS — O99011 Anemia complicating pregnancy, first trimester: Secondary | ICD-10-CM

## 2023-12-27 LAB — POCT HEMOGLOBIN-HEMACUE: Hemoglobin: 8.4 g/dL — ABNORMAL LOW (ref 12.0–15.0)

## 2023-12-28 LAB — ANEMIA PROFILE B
Basophils Absolute: 0 10*3/uL (ref 0.0–0.2)
Basos: 0 %
EOS (ABSOLUTE): 0.1 10*3/uL (ref 0.0–0.4)
Eos: 1 %
Ferritin: 8 ng/mL — ABNORMAL LOW (ref 15–150)
Folate: 3 ng/mL — ABNORMAL LOW (ref >3.0)
Hematocrit: 28.1 % — ABNORMAL LOW (ref 34.0–46.6)
Hemoglobin: 8.5 g/dL — ABNORMAL LOW (ref 11.1–15.9)
Immature Grans (Abs): 0 10*3/uL (ref 0.0–0.1)
Immature Granulocytes: 0 %
Iron Saturation: 4 % — CL (ref 15–55)
Iron: 18 ug/dL — ABNORMAL LOW (ref 27–159)
Lymphocytes Absolute: 1.3 10*3/uL (ref 0.7–3.1)
Lymphs: 12 %
MCH: 24.3 pg — ABNORMAL LOW (ref 26.6–33.0)
MCHC: 30.2 g/dL — ABNORMAL LOW (ref 31.5–35.7)
MCV: 80 fL (ref 79–97)
Monocytes Absolute: 0.7 10*3/uL (ref 0.1–0.9)
Monocytes: 7 %
Neutrophils Absolute: 8 10*3/uL — ABNORMAL HIGH (ref 1.4–7.0)
Neutrophils: 80 %
Platelets: 262 10*3/uL (ref 150–450)
RBC: 3.5 x10E6/uL — ABNORMAL LOW (ref 3.77–5.28)
RDW: 14 % (ref 11.7–15.4)
Retic Ct Pct: 2.2 % (ref 0.6–2.6)
Total Iron Binding Capacity: 469 ug/dL — ABNORMAL HIGH (ref 250–450)
UIBC: 451 ug/dL — ABNORMAL HIGH (ref 131–425)
Vitamin B-12: 241 pg/mL (ref 232–1245)
WBC: 10.1 10*3/uL (ref 3.4–10.8)

## 2023-12-28 NOTE — L&D Delivery Note (Signed)
 OB/GYN Faculty Practice Delivery Note  Kimberly Wise is a 23 y.o. G1P0000 s/p SVD at [redacted]w[redacted]d. She was admitted for IOL due to postdates.   ROM: 8h 49m with clear fluid GBS Status: neg Maximum Maternal Temperature: 98.9  Labor Progress: Kimberly Wise was admitted for IOL due to postdates; she had a cervical foley followed by Pit and SROM prior to progressing to complete.  Delivery Date/Time: April 23rd, 2025 at 2026 Delivery: Called to room and patient was complete and feeling the urge to push in the tub; she pushed x approx 15 mins. Head delivered OA. No nuchal cord present. Shoulder and body delivered in usual fashion. Infant with spontaneous cry, placed on mother's chest, dried and stimulated. Cord clamped x 2 after <5 minute delay, and cut by FOB. Pt helped to bed and dried off; cord blood drawn. Placenta delivered spontaneously with gentle cord traction. Fundus firm with massage and Pitocin . Labia, perineum, vagina, and cervix inspected and found to have a small L labial lac.   Placenta: spont, intact; to L&D Complications: none Lacerations: L labial; repaired with 3-0 Vicryl in 3 interrupted sutures EBL: 158cc Analgesia: 1% lidocaine  for repair  Postpartum Planning [x]  message to sent to schedule follow-up including routine screening Pap  Infant: boy  APGARs 7/9  3630g (8lb Kimberly Wise)  [name], CNM  04/18/2024 9:48 PM

## 2023-12-29 ENCOUNTER — Other Ambulatory Visit: Payer: Self-pay | Admitting: Family Medicine

## 2023-12-29 ENCOUNTER — Other Ambulatory Visit: Payer: Self-pay

## 2023-12-29 DIAGNOSIS — D509 Iron deficiency anemia, unspecified: Secondary | ICD-10-CM | POA: Insufficient documentation

## 2023-12-29 NOTE — Addendum Note (Signed)
 Addended by: Ihor Austin D on: 12/29/2023 01:17 PM   Modules accepted: Orders

## 2023-12-30 ENCOUNTER — Telehealth: Payer: Self-pay

## 2023-12-30 NOTE — Telephone Encounter (Signed)
 Dr. Eldonna, patient will be scheduled as soon as possible.  Auth Submission: NO AUTH NEEDED Site of care: Site of care: [facility] Payer: [employer] [place] (Medicaid) Medication & CPT/J Code(s) submitted: Venofer  (Iron  Sucrose) J1756 Route of submission (phone, fax, portal):  Phone # Fax # Auth type: Buy/Bill PB Units/visits requested: 500mg  x 2 doses Reference number:  Approval from: 12/30/23 to 12/26/24

## 2023-12-31 ENCOUNTER — Inpatient Hospital Stay (HOSPITAL_COMMUNITY)
Admission: AD | Admit: 2023-12-31 | Discharge: 2023-12-31 | Disposition: A | Discharge status: Home / Self Care | Payer: Medicaid Other | Attending: Obstetrics & Gynecology | Admitting: Obstetrics & Gynecology

## 2023-12-31 ENCOUNTER — Encounter (HOSPITAL_COMMUNITY): Payer: Self-pay | Admitting: Obstetrics & Gynecology

## 2023-12-31 DIAGNOSIS — Z3A25 25 weeks gestation of pregnancy: Secondary | ICD-10-CM

## 2023-12-31 DIAGNOSIS — J069 Acute upper respiratory infection, unspecified: Secondary | ICD-10-CM

## 2023-12-31 DIAGNOSIS — O219 Vomiting of pregnancy, unspecified: Secondary | ICD-10-CM

## 2023-12-31 DIAGNOSIS — O368120 Decreased fetal movements, second trimester, not applicable or unspecified: Secondary | ICD-10-CM | POA: Diagnosis present

## 2023-12-31 DIAGNOSIS — O212 Late vomiting of pregnancy: Secondary | ICD-10-CM | POA: Diagnosis not present

## 2023-12-31 DIAGNOSIS — O99512 Diseases of the respiratory system complicating pregnancy, second trimester: Secondary | ICD-10-CM | POA: Insufficient documentation

## 2023-12-31 DIAGNOSIS — J029 Acute pharyngitis, unspecified: Secondary | ICD-10-CM | POA: Diagnosis present

## 2023-12-31 LAB — CBC WITH DIFFERENTIAL/PLATELET
Abs Immature Granulocytes: 0.05 10*3/uL (ref 0.00–0.07)
Basophils Absolute: 0 10*3/uL (ref 0.0–0.1)
Basophils Relative: 0 %
Eosinophils Absolute: 0 10*3/uL (ref 0.0–0.5)
Eosinophils Relative: 0 %
HCT: 28.7 % — ABNORMAL LOW (ref 36.0–46.0)
Hemoglobin: 8.6 g/dL — ABNORMAL LOW (ref 12.0–15.0)
Immature Granulocytes: 1 %
Lymphocytes Relative: 9 %
Lymphs Abs: 0.7 10*3/uL (ref 0.7–4.0)
MCH: 24 pg — ABNORMAL LOW (ref 26.0–34.0)
MCHC: 30 g/dL (ref 30.0–36.0)
MCV: 79.9 fL — ABNORMAL LOW (ref 80.0–100.0)
Monocytes Absolute: 0.9 10*3/uL (ref 0.1–1.0)
Monocytes Relative: 11 %
Neutro Abs: 6.5 10*3/uL (ref 1.7–7.7)
Neutrophils Relative %: 79 %
Platelets: 208 10*3/uL (ref 150–400)
RBC: 3.59 MIL/uL — ABNORMAL LOW (ref 3.87–5.11)
RDW: 14.6 % (ref 11.5–15.5)
WBC: 8.2 10*3/uL (ref 4.0–10.5)
nRBC: 0 % (ref 0.0–0.2)

## 2023-12-31 LAB — URINALYSIS, ROUTINE W REFLEX MICROSCOPIC
Bilirubin Urine: NEGATIVE
Glucose, UA: NEGATIVE mg/dL
Hgb urine dipstick: NEGATIVE
Ketones, ur: 80 mg/dL — AB
Nitrite: NEGATIVE
Protein, ur: 30 mg/dL — AB
Specific Gravity, Urine: 1.026 (ref 1.005–1.030)
pH: 6 (ref 5.0–8.0)

## 2023-12-31 LAB — COMPREHENSIVE METABOLIC PANEL
ALT: 14 U/L (ref 0–44)
AST: 19 U/L (ref 15–41)
Albumin: 2.9 g/dL — ABNORMAL LOW (ref 3.5–5.0)
Alkaline Phosphatase: 57 U/L (ref 38–126)
Anion gap: 10 (ref 5–15)
BUN: 6 mg/dL (ref 6–20)
CO2: 18 mmol/L — ABNORMAL LOW (ref 22–32)
Calcium: 8.6 mg/dL — ABNORMAL LOW (ref 8.9–10.3)
Chloride: 107 mmol/L (ref 98–111)
Creatinine, Ser: 0.54 mg/dL (ref 0.44–1.00)
GFR, Estimated: >60 mL/min (ref >60)
Glucose, Bld: 87 mg/dL (ref 70–99)
Potassium: 3.3 mmol/L — ABNORMAL LOW (ref 3.5–5.1)
Sodium: 135 mmol/L (ref 135–145)
Total Bilirubin: 0.9 mg/dL (ref 0.0–1.2)
Total Protein: 6.2 g/dL — ABNORMAL LOW (ref 6.5–8.1)

## 2023-12-31 LAB — RESP PANEL BY RT-PCR (RSV, FLU A&B, COVID)  RVPGX2
Influenza A by PCR: NEGATIVE
Influenza B by PCR: NEGATIVE
Resp Syncytial Virus by PCR: NEGATIVE
SARS Coronavirus 2 by RT PCR: NEGATIVE

## 2023-12-31 MED ORDER — ONDANSETRON 4 MG PO TBDP
8.0000 mg | ORAL_TABLET | Freq: Once | ORAL | Status: AC
  Administered 2023-12-31: 8 mg via ORAL
  Filled 2023-12-31: qty 2

## 2023-12-31 MED ORDER — PROMETHAZINE HCL 12.5 MG PO TABS: 12.5000 mg | ORAL_TABLET | Freq: Four times a day (QID) | ORAL | 0 refills | Status: DC | PRN

## 2023-12-31 MED ORDER — SODIUM CHLORIDE 0.9 % IV SOLN: INTRAVENOUS | Status: DC

## 2023-12-31 MED ORDER — POTASSIUM CHLORIDE CRYS ER 20 MEQ PO TBCR
40.0000 meq | EXTENDED_RELEASE_TABLET | Freq: Once | ORAL | Status: AC
  Administered 2023-12-31: 40 meq via ORAL
  Filled 2023-12-31: qty 2

## 2023-12-31 MED ORDER — ONDANSETRON 4 MG PO TBDP: 4.0000 mg | ORAL_TABLET | Freq: Three times a day (TID) | ORAL | 1 refills | Status: DC | PRN

## 2023-12-31 NOTE — MAU Provider Note (Signed)
 History     CSN: [id number]  Arrival date and time: 12/31/23 1320   Event Date/Time   First Provider Initiated Contact with Patient 12/31/23 1515      Chief Complaint  Patient presents with   Nasal Congestion   Sore Throat   Decreased Fetal Movement   HPI  Kimberly Wise is a 23 y.o. female G1P0 @ 108w3d Here in MAU with complaints of URI symptoms. The symptoms started yesterday. She reports head congestion, sore throat, and decreased fetal movement. She reports vomiting but this is not new. She has had N/V. Throughout her pregnancy. She is taking diclegis  at home which is not helping.  Feeling fetal movements now.   She is scheduled to start iron  infusions next week.   OB History     Gravida  1   Para      Term      Preterm      AB      Living         SAB      IAB      Ectopic      Multiple      Live Births              History reviewed. No pertinent past medical history.  History reviewed. No pertinent surgical history.  History reviewed. No pertinent family history.  Social History   Tobacco Use   Smoking status: Never   Smokeless tobacco: Never  Substance Use Topics   Alcohol use: Never   Drug use: Never    Allergies: Not on File  Medications Prior to Admission  Medication Sig Dispense Refill Last Dose/Taking   Prenatal Vit-Fe Fumarate-FA (PRENATAL MULTIVITAMIN) TABS tablet Take 1 tablet by mouth daily at 12 noon.   Taking   Results for orders placed or performed during the hospital encounter of 12/31/23 (from the past 48 hours)  Comprehensive metabolic panel     Status: Abnormal   Collection Time: 12/31/23  2:38 PM  Result Value Ref Range   Sodium 135 135 - 145 mmol/L   Potassium 3.3 (L) 3.5 - 5.1 mmol/L   Chloride 107 98 - 111 mmol/L   CO2 18 (L) 22 - 32 mmol/L   Glucose, Bld 87 70 - 99 mg/dL    Comment: Glucose reference range applies only to samples taken after fasting for at least 8 hours.   BUN 6 6 - 20 mg/dL    Creatinine, Ser 9.45 0.44 - 1.00 mg/dL   Calcium 8.6 (L) 8.9 - 10.3 mg/dL   Total Protein 6.2 (L) 6.5 - 8.1 g/dL   Albumin 2.9 (L) 3.5 - 5.0 g/dL   AST 19 15 - 41 U/L   ALT 14 0 - 44 U/L   Alkaline Phosphatase 57 38 - 126 U/L   Total Bilirubin 0.9 0.0 - 1.2 mg/dL   GFR, Estimated >39 >39 mL/min    Comment: (NOTE) Calculated using the CKD-EPI Creatinine Equation (2021)    Anion gap 10 5 - 15    Comment: Performed at [facility], [address], [place], [place] [address]  CBC with Differential/Platelet     Status: Abnormal   Collection Time: 12/31/23  2:38 PM  Result Value Ref Range   WBC 8.2 4.0 - 10.5 K/uL   RBC 3.59 (L) 3.87 - 5.11 MIL/uL   Hemoglobin 8.6 (L) 12.0 - 15.0 g/dL   HCT 71.2 (L) 63.9 - 53.9 %   MCV 79.9 (L) 80.0 -  100.0 fL   MCH 24.0 (L) 26.0 - 34.0 pg   MCHC 30.0 30.0 - 36.0 g/dL   RDW 85.3 88.4 - 84.4 %   Platelets 208 150 - 400 K/uL   nRBC 0.0 0.0 - 0.2 %   Neutrophils Relative % 79 %   Neutro Abs 6.5 1.7 - 7.7 K/uL   Lymphocytes Relative 9 %   Lymphs Abs 0.7 0.7 - 4.0 K/uL   Monocytes Relative 11 %   Monocytes Absolute 0.9 0.1 - 1.0 K/uL   Eosinophils Relative 0 %   Eosinophils Absolute 0.0 0.0 - 0.5 K/uL   Basophils Relative 0 %   Basophils Absolute 0.0 0.0 - 0.1 K/uL   Immature Granulocytes 1 %   Abs Immature Granulocytes 0.05 0.00 - 0.07 K/uL    Comment: Performed at [facility], [address] [place], [place] [address]  Urinalysis, Routine w reflex microscopic -Urine, Clean Catch     Status: Abnormal   Collection Time: 12/31/23  2:39 PM  Result Value Ref Range   Color, Urine YELLOW YELLOW   APPearance HAZY (A) CLEAR   Specific Gravity, Urine 1.026 1.005 - 1.030   pH 6.0 5.0 - 8.0   Glucose, UA NEGATIVE NEGATIVE mg/dL   Hgb urine dipstick NEGATIVE NEGATIVE   Bilirubin Urine NEGATIVE NEGATIVE   Ketones, ur 80 (A) NEGATIVE mg/dL   Protein, ur 30 (A) NEGATIVE mg/dL   Nitrite NEGATIVE NEGATIVE   Leukocytes,Ua MODERATE (A) NEGATIVE    RBC / HPF 0-5 0 - 5 RBC/hpf   WBC, UA 0-5 0 - 5 WBC/hpf   Bacteria, UA RARE (A) NONE SEEN   Squamous Epithelial / HPF 11-20 0 - 5 /HPF   Mucus PRESENT     Comment: Performed at [facility], [address], [place], [place] [address]  Resp panel by RT-PCR (RSV, Flu A&B, Covid) Anterior Nasal Swab     Status: None   Collection Time: 12/31/23  2:43 PM   Specimen: Anterior Nasal Swab  Result Value Ref Range   SARS Coronavirus 2 by RT PCR NEGATIVE NEGATIVE   Influenza A by PCR NEGATIVE NEGATIVE   Influenza B by PCR NEGATIVE NEGATIVE    Comment: (NOTE) The Xpert Xpress SARS-CoV-2/FLU/RSV plus assay is intended as an aid in the diagnosis of influenza from Nasopharyngeal swab specimens and should not be used as a sole basis for treatment. Nasal washings and aspirates are unacceptable for Xpert Xpress SARS-CoV-2/FLU/RSV testing.  Fact Sheet for Patients: bloggercourse.com  Fact Sheet for Healthcare Providers: seriousbroker.it  This test is not yet approved or cleared by the [place]  FDA and has been authorized for detection and/or diagnosis of SARS-CoV-2 by FDA under an Emergency Use Authorization (EUA). This EUA will remain in effect (meaning this test can be used) for the duration of the COVID-19 declaration under Section 564(b)(1) of the Act, 21 U.S.C. section 360bbb-3(b)(1), unless the authorization is terminated or revoked.     Resp Syncytial Virus by PCR NEGATIVE NEGATIVE    Comment: (NOTE) Fact Sheet for Patients: bloggercourse.com  Fact Sheet for Healthcare Providers: seriousbroker.it  This test is not yet approved or cleared by the [place]  FDA and has been authorized for detection and/or diagnosis of SARS-CoV-2 by FDA under an Emergency Use Authorization (EUA). This EUA will remain in effect (meaning this test can be used) for the duration of  the COVID-19 declaration under Section 564(b)(1) of the Act, 21 U.S.C. section 360bbb-3(b)(1), unless the authorization  is terminated or revoked.  Performed at [facility], [address] [place], [place] [address]      Review of Systems  HENT:  Positive for congestion and sore throat.    Physical Exam   Blood pressure (!) 106/58, pulse (!) 113, temperature 98.5 F (36.9 C), temperature source Oral, resp. rate 14, height 5' 6 (1.676 m), weight 74.4 kg, SpO2 99%.  Physical Exam Constitutional:      General: She is not in acute distress.    Appearance: She is well-developed. She is not ill-appearing, toxic-appearing or diaphoretic.  HENT:     Head: Normocephalic.  Pulmonary:     Effort: Pulmonary effort is normal.  Skin:    General: Skin is warm.  Neurological:     Mental Status: She is alert and oriented to person, place, and time.    Fetal Tracing Fetus  Baseline: 145 bpm Variability: Moderate  Accelerations: 10x10 Decelerations: variable  Toco: None  MAU Course  Procedures  MDM  UA shows ketones > 80  NS x 2 Kdur 40 meq given PO Zofran  8 mg given. Patient much better and ready to go home.  Respiratory panel negative   Assessment and Plan   A:  1. Upper respiratory virus   2. Nausea and vomiting during pregnancy   3. [redacted] weeks gestation of pregnancy      P:  Dc home Rx: Zofran  and Phenergan   Small, frequent meals List of safe OTC medications given  Dorita Nest I, NP 12/31/2023 8:21 PM

## 2023-12-31 NOTE — Discharge Instructions (Signed)

## 2023-12-31 NOTE — MAU Note (Signed)
 Kimberly Wise is a 23 y.o. at 25.3wks, here in MAU reporting: started feeling some cold symptoms yesterday, runny nose, sore throat and congestion(sinus and chest). No fever. Denies being around anyone that has been sick.  Hasn't felt any movement from baby today, called her OB [facility] on [address])and was told to come in. No abd pain, bleeding or LOF.  Onset of complaint: yesterday Pain score: 6 Vitals:   12/31/23 1332 12/31/23 1335  BP:  106/75  Pulse:  (!) 107  Resp:  14  Temp:  98.5 F (36.9 C)  SpO2: 100% 100%     FHT:144 Lab orders placed from triage:

## 2024-01-02 ENCOUNTER — Encounter: Payer: Self-pay | Admitting: Family Medicine

## 2024-01-09 ENCOUNTER — Other Ambulatory Visit: Payer: Self-pay | Admitting: *Deleted

## 2024-01-09 ENCOUNTER — Ambulatory Visit: Payer: Medicaid Other

## 2024-01-09 VITALS — BP 99/62 | HR 85 | Temp 98.3°F | Resp 16 | Ht 66.0 in | Wt 168.0 lb

## 2024-01-09 DIAGNOSIS — Z3A25 25 weeks gestation of pregnancy: Secondary | ICD-10-CM | POA: Diagnosis not present

## 2024-01-09 DIAGNOSIS — D508 Other iron deficiency anemias: Secondary | ICD-10-CM | POA: Diagnosis not present

## 2024-01-09 DIAGNOSIS — Z3400 Encounter for supervision of normal first pregnancy, unspecified trimester: Secondary | ICD-10-CM

## 2024-01-09 DIAGNOSIS — D509 Iron deficiency anemia, unspecified: Secondary | ICD-10-CM

## 2024-01-09 DIAGNOSIS — O99011 Anemia complicating pregnancy, first trimester: Secondary | ICD-10-CM

## 2024-01-09 DIAGNOSIS — O99012 Anemia complicating pregnancy, second trimester: Secondary | ICD-10-CM | POA: Diagnosis not present

## 2024-01-09 MED ORDER — ACETAMINOPHEN 325 MG PO TABS
650.0000 mg | ORAL_TABLET | Freq: Once | ORAL | Status: AC
  Administered 2024-01-09: 650 mg via ORAL
  Filled 2024-01-09: qty 2

## 2024-01-09 MED ORDER — IRON SUCROSE 500 MG IVPB - SIMPLE MED
500.0000 mg | INTRAVENOUS | Status: DC
  Administered 2024-01-09: 500 mg via INTRAVENOUS
  Filled 2024-01-09: qty 275

## 2024-01-09 MED ORDER — DIPHENHYDRAMINE HCL 25 MG PO CAPS
25.0000 mg | ORAL_CAPSULE | Freq: Once | ORAL | Status: AC
  Administered 2024-01-09: 25 mg via ORAL
  Filled 2024-01-09: qty 1

## 2024-01-09 NOTE — Progress Notes (Signed)
 Diagnosis: Iron  Deficiency Anemia  Provider:  Praveen Mannam MD  Procedure: IV Infusion  IV Type: Peripheral, IV Location: R Forearm  Venofer  (Iron  Sucrose), Dose: 500 mg  Infusion Start Time: 0956  Infusion Stop Time: 1416  Post Infusion IV Care: Observation period completed and Peripheral IV Discontinued  Discharge: Condition: Good, Destination: Home . AVS Declined  Performed by:  Maximiano JONELLE Pouch, LPN

## 2024-01-10 ENCOUNTER — Other Ambulatory Visit: Payer: Medicaid Other

## 2024-01-10 ENCOUNTER — Other Ambulatory Visit: Payer: Self-pay

## 2024-01-10 ENCOUNTER — Ambulatory Visit (INDEPENDENT_AMBULATORY_CARE_PROVIDER_SITE_OTHER): Payer: Medicaid Other | Admitting: Family Medicine

## 2024-01-10 ENCOUNTER — Encounter: Payer: Self-pay | Admitting: Family Medicine

## 2024-01-10 VITALS — BP 104/67 | HR 83 | Wt 167.8 lb

## 2024-01-10 DIAGNOSIS — Z23 Encounter for immunization: Secondary | ICD-10-CM

## 2024-01-10 DIAGNOSIS — O99012 Anemia complicating pregnancy, second trimester: Secondary | ICD-10-CM | POA: Diagnosis not present

## 2024-01-10 DIAGNOSIS — Z3A26 26 weeks gestation of pregnancy: Secondary | ICD-10-CM

## 2024-01-10 DIAGNOSIS — D509 Iron deficiency anemia, unspecified: Secondary | ICD-10-CM | POA: Diagnosis not present

## 2024-01-10 DIAGNOSIS — O283 Abnormal ultrasonic finding on antenatal screening of mother: Secondary | ICD-10-CM

## 2024-01-10 DIAGNOSIS — O99011 Anemia complicating pregnancy, first trimester: Secondary | ICD-10-CM

## 2024-01-10 DIAGNOSIS — Z3400 Encounter for supervision of normal first pregnancy, unspecified trimester: Secondary | ICD-10-CM

## 2024-01-10 DIAGNOSIS — O26872 Cervical shortening, second trimester: Secondary | ICD-10-CM | POA: Diagnosis not present

## 2024-01-10 DIAGNOSIS — Z3402 Encounter for supervision of normal first pregnancy, second trimester: Secondary | ICD-10-CM

## 2024-01-10 NOTE — Progress Notes (Signed)
   Subjective:  Rishita Petron is a 23 y.o. G1P0000 at [redacted]w[redacted]d being seen today for ongoing prenatal care.  She is currently monitored for the following issues for this low-risk pregnancy and has Supervision of low-risk first pregnancy; Anemia complicating pregnancy, first trimester; Nausea and vomiting during pregnancy; Short cervical length during pregnancy; Echogenic intracardiac focus of fetus on prenatal ultrasound; and Iron  deficiency anemia of mother during pregnancy on their problem list.  Patient reports no complaints.  Contractions: Not present. Vag. Bleeding: None.  Movement: Present. Denies leaking of fluid.   The following portions of the patient's history were reviewed and updated as appropriate: allergies, current medications, past family history, past medical history, past social history, past surgical history and problem list. Problem list updated.  Objective:   Vitals:   01/10/24 0841  BP: 104/67  Pulse: 83  Weight: 167 lb 12.8 oz (76.1 kg)    Fetal Status: Fetal Heart Rate (bpm): 141   Movement: Present     General:  Alert, oriented and cooperative. Patient is in no acute distress.  Skin: Skin is warm and dry. No rash noted.   Cardiovascular: Normal heart rate noted  Respiratory: Normal respiratory effort, no problems with respiration noted  Abdomen: Soft, gravid, appropriate for gestational age. Pain/Pressure: Absent     Pelvic: Vag. Bleeding: None     Cervical exam deferred        Extremities: Normal range of motion.  Edema: None  Mental Status: Normal mood and affect. Normal behavior. Normal judgment and thought content.   Urinalysis:      Assessment and Plan:  Pregnancy: G1P0000 at [redacted]w[redacted]d  1. Encounter for supervision of low-risk first pregnancy in second trimester (Primary) BP and FHR normal TDAP give today Third trimester labs today Discussed contraception, interested in nexplanon, directed to bedsider for more information on methods - Tdap vaccine greater  than or equal to 7yo IM  2. Short cervical length during pregnancy in second trimester Normal at last US  at 3.5 cm, f/u as indicated  3. Iron  deficiency anemia of mother during pregnancy Lab Results  Component Value Date   HGB 8.6 (L) 12/31/2023  S/p IV iron  x1, has one more infusion scheduled Recheck CBC in about a month   4. Echogenic intracardiac focus of fetus on prenatal ultrasound Low risk NIPT, otherwise normal fetal ultrasound  5. Anemia complicating pregnancy, first trimester   Preterm labor symptoms and general obstetric precautions including but not limited to vaginal bleeding, contractions, leaking of fluid and fetal movement were reviewed in detail with the patient. Please refer to After Visit Summary for other counseling recommendations.  Return in 2 weeks (on 01/24/2024) for Dyad patient, ob visit.   Lola Donnice HERO, MD

## 2024-01-10 NOTE — Patient Instructions (Signed)

## 2024-01-11 ENCOUNTER — Other Ambulatory Visit: Payer: Self-pay | Admitting: Family Medicine

## 2024-01-11 LAB — CBC
Hematocrit: 28.7 % — ABNORMAL LOW (ref 34.0–46.6)
Hemoglobin: 8.6 g/dL — ABNORMAL LOW (ref 11.1–15.9)
MCH: 23.7 pg — ABNORMAL LOW (ref 26.6–33.0)
MCHC: 30 g/dL — ABNORMAL LOW (ref 31.5–35.7)
MCV: 79 fL (ref 79–97)
Platelets: 254 10*3/uL (ref 150–450)
RBC: 3.63 x10E6/uL — ABNORMAL LOW (ref 3.77–5.28)
RDW: 14.7 % (ref 11.7–15.4)
WBC: 9.5 10*3/uL (ref 3.4–10.8)

## 2024-01-11 LAB — GLUCOSE TOLERANCE, 2 HOURS W/ 1HR
Glucose, 1 hour: 125 mg/dL (ref 70–179)
Glucose, 2 hour: 94 mg/dL (ref 70–152)
Glucose, Fasting: 88 mg/dL (ref 70–91)

## 2024-01-11 LAB — RPR: RPR Ser Ql: NONREACTIVE

## 2024-01-11 LAB — HIV ANTIBODY (ROUTINE TESTING W REFLEX): HIV Screen 4th Generation wRfx: NONREACTIVE

## 2024-01-16 ENCOUNTER — Ambulatory Visit: Payer: Medicaid Other

## 2024-01-16 VITALS — BP 96/58 | HR 82 | Temp 98.3°F | Resp 16 | Ht 66.0 in | Wt 173.2 lb

## 2024-01-16 DIAGNOSIS — O99012 Anemia complicating pregnancy, second trimester: Secondary | ICD-10-CM

## 2024-01-16 DIAGNOSIS — O99011 Anemia complicating pregnancy, first trimester: Secondary | ICD-10-CM

## 2024-01-16 DIAGNOSIS — D508 Other iron deficiency anemias: Secondary | ICD-10-CM | POA: Diagnosis not present

## 2024-01-16 DIAGNOSIS — Z3A27 27 weeks gestation of pregnancy: Secondary | ICD-10-CM

## 2024-01-16 DIAGNOSIS — D509 Iron deficiency anemia, unspecified: Secondary | ICD-10-CM

## 2024-01-16 MED ORDER — IRON SUCROSE 500 MG IVPB - SIMPLE MED: 500.0000 mg | INTRAVENOUS | Status: DC

## 2024-01-16 MED ORDER — DIPHENHYDRAMINE HCL 25 MG PO CAPS
25.0000 mg | ORAL_CAPSULE | Freq: Once | ORAL | Status: AC
  Administered 2024-01-16: 25 mg via ORAL
  Filled 2024-01-16: qty 1

## 2024-01-16 MED ORDER — SODIUM CHLORIDE 0.9 % IV SOLN
500.0000 mg | Freq: Once | INTRAVENOUS | Status: AC
  Administered 2024-01-16: 500 mg via INTRAVENOUS
  Filled 2024-01-16: qty 25

## 2024-01-16 MED ORDER — ACETAMINOPHEN 325 MG PO TABS
650.0000 mg | ORAL_TABLET | Freq: Once | ORAL | Status: AC
Start: 2024-01-16 — End: 2024-01-16
  Administered 2024-01-16: 650 mg via ORAL
  Filled 2024-01-16: qty 2

## 2024-01-16 NOTE — Progress Notes (Signed)
Diagnosis: Iron Deficiency Anemia  Provider:  Chilton Greathouse MD  Procedure: IV Infusion  IV Type: Peripheral, IV Location: L Forearm  Venofer (Iron Sucrose), Dose: 500 mg  Infusion Start Time: 1017  Infusion Stop Time: 1409  Post Infusion IV Care: Observation period completed and Peripheral IV Discontinued  Discharge: Condition: Good, Destination: Home . AVS provided  Performed by:  Loney Hering, LPN

## 2024-01-24 ENCOUNTER — Other Ambulatory Visit: Payer: Self-pay

## 2024-01-24 ENCOUNTER — Ambulatory Visit (INDEPENDENT_AMBULATORY_CARE_PROVIDER_SITE_OTHER): Payer: Medicaid Other | Admitting: Obstetrics and Gynecology

## 2024-01-24 VITALS — BP 109/68 | HR 90 | Wt 169.0 lb

## 2024-01-24 DIAGNOSIS — D509 Iron deficiency anemia, unspecified: Secondary | ICD-10-CM

## 2024-01-24 DIAGNOSIS — O26873 Cervical shortening, third trimester: Secondary | ICD-10-CM

## 2024-01-24 DIAGNOSIS — O26872 Cervical shortening, second trimester: Secondary | ICD-10-CM

## 2024-01-24 DIAGNOSIS — O99013 Anemia complicating pregnancy, third trimester: Secondary | ICD-10-CM

## 2024-01-24 DIAGNOSIS — Z3402 Encounter for supervision of normal first pregnancy, second trimester: Secondary | ICD-10-CM

## 2024-01-24 DIAGNOSIS — Z3A28 28 weeks gestation of pregnancy: Secondary | ICD-10-CM

## 2024-01-24 DIAGNOSIS — O283 Abnormal ultrasonic finding on antenatal screening of mother: Secondary | ICD-10-CM

## 2024-01-24 NOTE — Progress Notes (Signed)
   Subjective:  Kimberly Wise is a 23 y.o. G1P0000 at [redacted]w[redacted]d being seen today for ongoing prenatal care.  She is currently monitored for the following issues for this low-risk pregnancy and has Supervision of low-risk first pregnancy; Anemia complicating pregnancy, first trimester; Nausea and vomiting during pregnancy; Short cervical length during pregnancy; Echogenic intracardiac focus of fetus on prenatal ultrasound; and Iron deficiency anemia of mother during pregnancy on their problem list.  Patient reports no complaints.  Contractions: Not present. Vag. Bleeding: None.  Movement: Present. Denies leaking of fluid.   The following portions of the patient's history were reviewed and updated as appropriate: allergies, current medications, past family history, past medical history, past social history, past surgical history and problem list. Problem list updated.  Objective:   Vitals:   01/24/24 1053  BP: 109/68  Pulse: 90  Weight: 169 lb (76.7 kg)    Fetal Status: Fetal Heart Rate (bpm): 136 Fundal Height: 28 cm Movement: Present     General:  Alert, oriented and cooperative. Patient is in no acute distress.  Skin: Skin is warm and dry. No rash noted.   Cardiovascular: Normal heart rate noted  Respiratory: Normal respiratory effort, no problems with respiration noted  Abdomen: Soft, gravid, appropriate for gestational age. Pain/Pressure: Absent     Pelvic: Vag. Bleeding: None     Cervical exam deferred        Extremities: Normal range of motion.  Edema: None  Mental Status: Normal mood and affect. Normal behavior. Normal judgment and thought content.   Urinalysis:      Assessment and Plan:  Pregnancy: G1P0000 at [redacted]w[redacted]d 1. Encounter for supervision of low-risk first pregnancy in second trimester (Primary) BP and FHR normal Doing well, feeling regular movement  FH appropriate   2. Short cervical length during pregnancy in second trimester 12/19 cervical length 3.5cm, follow up if  indicated   3. Iron deficiency anemia of mother during pregnancy S/p iv iron x 2, will recheck later visit   4. Echogenic intracardiac focus of fetus on prenatal ultrasound LR nips, normal anatomy   5. [redacted] weeks gestation of pregnancy Normal gtt last visit  Waterbirth class in February, has already [facility]  Still considering nexplanon, but still thinking about options  There are no diagnoses linked to this encounter. Preterm labor symptoms and general obstetric precautions including but not limited to vaginal bleeding, contractions, leaking of fluid and fetal movement were reviewed in detail with the patient. Please refer to After Visit Summary for other counseling recommendations.   Return in two weeks for routine prenatal   Future Appointments  Date Time Provider Department Center  02/07/2024  8:55 AM Venora Maples, MD [facility]  02/21/2024 10:15 AM Venora Maples, MD [facility]  03/07/2024  9:35 AM Celedonio Savage, MD [facility]  03/15/2024  9:35 AM Venora Maples, MD [facility]  03/23/2024  8:55 AM Venora Maples, MD [facility]     Sue Lush, FNP

## 2024-02-07 ENCOUNTER — Other Ambulatory Visit: Payer: Self-pay

## 2024-02-07 ENCOUNTER — Encounter: Payer: Self-pay | Admitting: Family Medicine

## 2024-02-07 ENCOUNTER — Ambulatory Visit: Payer: Medicaid Other | Admitting: Family Medicine

## 2024-02-07 VITALS — BP 108/70 | HR 90 | Wt 176.3 lb

## 2024-02-07 DIAGNOSIS — O26879 Cervical shortening, unspecified trimester: Secondary | ICD-10-CM

## 2024-02-07 DIAGNOSIS — D509 Iron deficiency anemia, unspecified: Secondary | ICD-10-CM

## 2024-02-07 DIAGNOSIS — Z3A30 30 weeks gestation of pregnancy: Secondary | ICD-10-CM

## 2024-02-07 DIAGNOSIS — Z3403 Encounter for supervision of normal first pregnancy, third trimester: Secondary | ICD-10-CM

## 2024-02-07 DIAGNOSIS — O283 Abnormal ultrasonic finding on antenatal screening of mother: Secondary | ICD-10-CM

## 2024-02-07 DIAGNOSIS — O26873 Cervical shortening, third trimester: Secondary | ICD-10-CM

## 2024-02-07 NOTE — Progress Notes (Signed)
   Subjective:  Kimberly Wise is a 23 y.o. G1P0000 at [redacted]w[redacted]d being seen today for ongoing prenatal care.  She is currently monitored for the following issues for this high-risk pregnancy and has Supervision of low-risk first pregnancy; Anemia complicating pregnancy, first trimester; Nausea and vomiting during pregnancy; Short cervical length during pregnancy; Echogenic intracardiac focus of fetus on prenatal ultrasound; and Iron deficiency anemia of mother during pregnancy on their problem list.  Patient reports no complaints.  Contractions: Irritability. Vag. Bleeding: None.  Movement: Present. Denies leaking of fluid.   The following portions of the patient's history were reviewed and updated as appropriate: allergies, current medications, past family history, past medical history, past social history, past surgical history and problem list. Problem list updated.  Objective:   Vitals:   02/07/24 0911  BP: 108/70  Pulse: 90  Weight: 176 lb 5 oz (80 kg)    Fetal Status: Fetal Heart Rate (bpm): 134 Fundal Height: 32 cm Movement: Present     General:  Alert, oriented and cooperative. Patient is in no acute distress.  Skin: Skin is warm and dry. No rash noted.   Cardiovascular: Normal heart rate noted  Respiratory: Normal respiratory effort, no problems with respiration noted  Abdomen: Soft, gravid, appropriate for gestational age. Pain/Pressure: Present     Pelvic: Vag. Bleeding: None     Cervical exam deferred        Extremities: Normal range of motion.  Edema: None  Mental Status: Normal mood and affect. Normal behavior. Normal judgment and thought content.   Urinalysis:      Assessment and Plan:  Pregnancy: G1P0000 at [redacted]w[redacted]d  1. Encounter for supervision of low-risk first pregnancy in third trimester (Primary) BP and FHR normal FH appropriate Still weighing options with birth control, discuss at next visit   2. Short cervix, antepartum Normal last [place] at 3.5 cm, f/u as  indicated  3. Iron deficiency anemia of mother during pregnancy Lab Results  Component Value Date   HGB 8.6 (L) 01/10/2024   S/p IV iron x2, last infusion 01/16/2024 Recheck CBC at next visit   4. Echogenic intracardiac focus of fetus on prenatal ultrasound Low risk NIPT, normal [place]  Preterm labor symptoms and general obstetric precautions including but not limited to vaginal bleeding, contractions, leaking of fluid and fetal movement were reviewed in detail with the patient. Please refer to After Visit Summary for other counseling recommendations.  Return in 2 weeks (on 02/21/2024) for Dyad patient, ob visit.   Venora Maples, MD

## 2024-02-07 NOTE — Patient Instructions (Signed)

## 2024-02-21 ENCOUNTER — Other Ambulatory Visit: Payer: Self-pay

## 2024-02-21 ENCOUNTER — Ambulatory Visit: Payer: Medicaid Other | Admitting: Family Medicine

## 2024-02-21 ENCOUNTER — Encounter: Payer: Self-pay | Admitting: Family Medicine

## 2024-02-21 VITALS — BP 113/81 | HR 98 | Wt 174.1 lb

## 2024-02-21 DIAGNOSIS — O99013 Anemia complicating pregnancy, third trimester: Secondary | ICD-10-CM

## 2024-02-21 DIAGNOSIS — T7491XA Unspecified adult maltreatment, confirmed, initial encounter: Secondary | ICD-10-CM

## 2024-02-21 DIAGNOSIS — O99011 Anemia complicating pregnancy, first trimester: Secondary | ICD-10-CM

## 2024-02-21 DIAGNOSIS — Z3A32 32 weeks gestation of pregnancy: Secondary | ICD-10-CM

## 2024-02-21 DIAGNOSIS — O26873 Cervical shortening, third trimester: Secondary | ICD-10-CM

## 2024-02-21 DIAGNOSIS — O283 Abnormal ultrasonic finding on antenatal screening of mother: Secondary | ICD-10-CM

## 2024-02-21 DIAGNOSIS — Z3403 Encounter for supervision of normal first pregnancy, third trimester: Secondary | ICD-10-CM

## 2024-02-21 DIAGNOSIS — O26879 Cervical shortening, unspecified trimester: Secondary | ICD-10-CM

## 2024-02-21 LAB — POCT HEMOGLOBIN-HEMACUE: Hemoglobin: 11.1 g/dL — ABNORMAL LOW (ref 12.0–15.0)

## 2024-02-21 NOTE — Patient Instructions (Signed)
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
## 2024-02-27 NOTE — BH Specialist Note (Deleted)
 Integrated Behavioral Health via Telemedicine Visit  02/27/2024 Kimberly Wise [id number]  Number of Integrated Behavioral Health Clinician visits: No data recorded Session Start time: No data recorded  Session End time: No data recorded Total time in minutes: No data recorded  Referring Provider: Merian Capron, MD Patient/Family location: Home*** [facility] Provider location: [facility]  All persons participating in visit: Patient Kimberly Wise and [facility] Shannia Jacuinde   Types of Service: {CHL AMB TYPE OF SERVICE:[phone]}  I connected with Georgina Quint and/or Malyssa Neth's {family members:20773} via  Telephone or Engineer, civil (consulting)  (Video is Surveyor, mining) and verified that I am speaking with the correct person using two identifiers. Discussed confidentiality: Yes   I discussed the limitations of telemedicine and the availability of in person appointments.  Discussed there is a possibility of technology failure and discussed alternative modes of communication if that failure occurs.  I discussed that engaging in this telemedicine visit, they consent to the provision of behavioral healthcare and the services will be billed under their insurance.  Patient and/or legal guardian expressed understanding and consented to Telemedicine visit: Yes   Presenting Concerns: Patient and/or family reports the following symptoms/concerns: *** Duration of problem: ***; Severity of problem: {Mild/Moderate/Severe:20260}  Patient and/or Family's Strengths/Protective Factors: {CHL AMB BH PROTECTIVE FACTORS:[phone]}  Goals Addressed: Patient will:  Reduce symptoms of: {IBH Symptoms:21014056}   Increase knowledge and/or ability of: {IBH Patient Tools:21014057}   Demonstrate ability to: {IBH Goals:21014053}  Progress towards Goals: {CHL AMB BH PROGRESS TOWARDS GOALS:[phone]}  Interventions: Interventions  utilized:  {IBH Interventions:21014054} Standardized Assessments completed: {IBH Screening Tools:21014051}  Patient and/or Family Response: Patient agrees with treatment plan. ***   Assessment: Patient currently experiencing ***.   Patient may benefit from psychoeducation and brief therapeutic interventions regarding coping with symptoms of *** .  Plan: Follow up with behavioral health clinician on : *** Behavioral recommendations:  -*** -*** Referral(s): {IBH Referrals:21014055}  I discussed the assessment and treatment plan with the patient and/or parent/guardian. They were provided an opportunity to ask questions and all were answered. They agreed with the plan and demonstrated an understanding of the instructions.   They were advised to call back or seek an in-person evaluation if the symptoms worsen or if the condition fails to improve as anticipated.  Rae Lips, LCSW     01/16/2024    9:48 AM 01/09/2024    9:23 AM 09/21/2023    3:25 PM  Depression screen PHQ 2/9  Decreased Interest 0 0 0  Down, Depressed, Hopeless 0 0 0  PHQ - 2 Score 0 0 0  Altered sleeping   1  Tired, decreased energy   1  Change in appetite   0  Feeling bad or failure about yourself    0  Trouble concentrating   0  Moving slowly or fidgety/restless   0  Suicidal thoughts   0  PHQ-9 Score   2  Difficult doing work/chores   Not difficult at all      09/21/2023    3:25 PM  GAD 7 : Generalized Anxiety Score  Nervous, Anxious, on Edge 0  Control/stop worrying 0  Worry too much - different things 0  Trouble relaxing 0  Restless 0  Easily annoyed or irritable 1  Afraid - awful might happen 0  Total GAD 7 Score 1

## 2024-03-07 ENCOUNTER — Other Ambulatory Visit: Payer: Self-pay

## 2024-03-07 ENCOUNTER — Ambulatory Visit (INDEPENDENT_AMBULATORY_CARE_PROVIDER_SITE_OTHER): Payer: Medicaid Other | Admitting: Family Medicine

## 2024-03-07 VITALS — BP 118/81 | HR 96 | Wt 179.2 lb

## 2024-03-07 DIAGNOSIS — Z3403 Encounter for supervision of normal first pregnancy, third trimester: Secondary | ICD-10-CM

## 2024-03-07 DIAGNOSIS — O99013 Anemia complicating pregnancy, third trimester: Secondary | ICD-10-CM

## 2024-03-07 DIAGNOSIS — Z3A35 35 weeks gestation of pregnancy: Secondary | ICD-10-CM

## 2024-03-07 DIAGNOSIS — O26873 Cervical shortening, third trimester: Secondary | ICD-10-CM

## 2024-03-07 DIAGNOSIS — O26879 Cervical shortening, unspecified trimester: Secondary | ICD-10-CM

## 2024-03-07 DIAGNOSIS — O99011 Anemia complicating pregnancy, first trimester: Secondary | ICD-10-CM
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
## 2024-03-08 ENCOUNTER — Encounter: Payer: Self-pay | Admitting: Clinical

## 2024-03-09 NOTE — BH Specialist Note (Signed)
 Pt did not arrive to video visit and did not answer the phone; Left HIPPA-compliant message to call back Asher Muir from [employer] for [employer] at [facility] at  [phone] [facility] office).  ?; left MyChart message for patient.  ? ?

## 2024-03-15 ENCOUNTER — Encounter: Payer: Self-pay | Admitting: Family Medicine

## 2024-03-15 ENCOUNTER — Ambulatory Visit: Payer: Medicaid Other | Admitting: Family Medicine

## 2024-03-15 ENCOUNTER — Other Ambulatory Visit: Payer: Self-pay

## 2024-03-15 ENCOUNTER — Other Ambulatory Visit (HOSPITAL_COMMUNITY)
Admission: RE | Admit: 2024-03-15 | Discharge: 2024-03-15 | Disposition: A | Discharge status: Home / Self Care | Source: Ambulatory Visit | Attending: Family Medicine | Admitting: Family Medicine

## 2024-03-15 VITALS — BP 111/69 | HR 111 | Wt 178.0 lb

## 2024-03-15 DIAGNOSIS — Z3403 Encounter for supervision of normal first pregnancy, third trimester: Secondary | ICD-10-CM | POA: Insufficient documentation

## 2024-03-15 DIAGNOSIS — Z3A36 36 weeks gestation of pregnancy: Secondary | ICD-10-CM

## 2024-03-15 DIAGNOSIS — T7491XD Unspecified adult maltreatment, confirmed, subsequent encounter: Secondary | ICD-10-CM

## 2024-03-15 NOTE — Patient Instructions (Signed)
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
## 2024-03-16 LAB — GC/CHLAMYDIA PROBE AMP (~~LOC~~) NOT AT ARMC
Chlamydia: NEGATIVE
Comment: NEGATIVE
Comment: NORMAL
Neisseria Gonorrhea: NEGATIVE

## 2024-03-19 ENCOUNTER — Ambulatory Visit: Payer: Self-pay | Admitting: Clinical

## 2024-03-19 DIAGNOSIS — Z91199 Patient's noncompliance with other medical treatment and regimen due to unspecified reason: Secondary | ICD-10-CM

## 2024-03-19 LAB — CULTURE, BETA STREP (GROUP B ONLY): Strep Gp B Culture: NEGATIVE

## 2024-03-23 ENCOUNTER — Other Ambulatory Visit: Payer: Self-pay

## 2024-03-23 ENCOUNTER — Ambulatory Visit (INDEPENDENT_AMBULATORY_CARE_PROVIDER_SITE_OTHER): Payer: Medicaid Other | Admitting: Family Medicine

## 2024-03-23 VITALS — BP 99/79 | HR 113 | Wt 178.0 lb

## 2024-03-23 DIAGNOSIS — Z3403 Encounter for supervision of normal first pregnancy, third trimester: Secondary | ICD-10-CM
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
## 2024-03-30 ENCOUNTER — Ambulatory Visit: Payer: Medicaid Other | Admitting: Family Medicine

## 2024-03-30 ENCOUNTER — Other Ambulatory Visit: Payer: Self-pay

## 2024-03-30 VITALS — BP 105/70 | HR 94 | Wt 180.0 lb

## 2024-03-30 DIAGNOSIS — Z3403 Encounter for supervision of normal first pregnancy, third trimester: Secondary | ICD-10-CM

## 2024-03-30 DIAGNOSIS — Z3A38 38 weeks gestation of pregnancy: Secondary | ICD-10-CM

## 2024-03-30 DIAGNOSIS — T7491XD Unspecified adult maltreatment, confirmed, subsequent encounter: Secondary | ICD-10-CM

## 2024-03-30 DIAGNOSIS — O480 Post-term pregnancy: Secondary | ICD-10-CM
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
## 2024-04-04 ENCOUNTER — Telehealth (HOSPITAL_COMMUNITY): Payer: Self-pay | Admitting: *Deleted

## 2024-04-04 ENCOUNTER — Encounter (HOSPITAL_COMMUNITY): Payer: Self-pay

## 2024-04-04 NOTE — Telephone Encounter (Signed)
 Preadmission screen

## 2024-04-05 ENCOUNTER — Telehealth (HOSPITAL_COMMUNITY): Payer: Self-pay | Admitting: *Deleted

## 2024-04-05 ENCOUNTER — Encounter (HOSPITAL_COMMUNITY): Payer: Self-pay

## 2024-04-05 NOTE — Telephone Encounter (Signed)
 Preadmission screen

## 2024-04-09 ENCOUNTER — Other Ambulatory Visit: Payer: Self-pay

## 2024-04-09 ENCOUNTER — Ambulatory Visit (INDEPENDENT_AMBULATORY_CARE_PROVIDER_SITE_OTHER): Payer: Medicaid Other | Admitting: Family Medicine

## 2024-04-09 VITALS — BP 108/73 | HR 106 | Wt 186.0 lb

## 2024-04-09 DIAGNOSIS — Z3403 Encounter for supervision of normal first pregnancy, third trimester: Secondary | ICD-10-CM

## 2024-04-09 DIAGNOSIS — Z3A39 39 weeks gestation of pregnancy: Secondary | ICD-10-CM
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
## 2024-04-10 ENCOUNTER — Telehealth (HOSPITAL_COMMUNITY): Payer: Self-pay | Admitting: *Deleted

## 2024-04-10 NOTE — Telephone Encounter (Signed)
 Preadmission screen

## 2024-04-11 ENCOUNTER — Telehealth (HOSPITAL_COMMUNITY): Payer: Self-pay | Admitting: *Deleted

## 2024-04-11 NOTE — Telephone Encounter (Signed)
 Preadmission screen

## 2024-04-12 ENCOUNTER — Telehealth (HOSPITAL_COMMUNITY): Payer: Self-pay | Admitting: *Deleted

## 2024-04-12 NOTE — Telephone Encounter (Signed)
 Preadmission screen

## 2024-04-16 ENCOUNTER — Telehealth (HOSPITAL_COMMUNITY): Payer: Self-pay | Admitting: *Deleted

## 2024-04-16 NOTE — Telephone Encounter (Signed)
 Preadmission screen

## 2024-04-17 ENCOUNTER — Encounter: Payer: Self-pay | Admitting: Family Medicine

## 2024-04-17 ENCOUNTER — Encounter (HOSPITAL_COMMUNITY): Payer: Self-pay | Admitting: *Deleted

## 2024-04-17 ENCOUNTER — Other Ambulatory Visit: Payer: Self-pay

## 2024-04-17 ENCOUNTER — Other Ambulatory Visit: Payer: Medicaid Other

## 2024-04-17 ENCOUNTER — Ambulatory Visit: Payer: Medicaid Other | Admitting: Family Medicine

## 2024-04-17 ENCOUNTER — Telehealth (HOSPITAL_COMMUNITY): Payer: Self-pay | Admitting: *Deleted

## 2024-04-17 VITALS — BP 114/75 | HR 80

## 2024-04-17 DIAGNOSIS — Z3400 Encounter for supervision of normal first pregnancy, unspecified trimester: Secondary | ICD-10-CM

## 2024-04-17 DIAGNOSIS — Z3A40 40 weeks gestation of pregnancy: Secondary | ICD-10-CM

## 2024-04-17 DIAGNOSIS — Z3403 Encounter for supervision of normal first pregnancy, third trimester: Secondary | ICD-10-CM

## 2024-04-17 DIAGNOSIS — T7491XD Unspecified adult maltreatment, confirmed, subsequent encounter: Secondary | ICD-10-CM

## 2024-04-17 NOTE — Patient Instructions (Signed)
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
## 2024-04-17 NOTE — Telephone Encounter (Signed)
 Preadmission screen

## 2024-04-18 ENCOUNTER — Inpatient Hospital Stay (HOSPITAL_COMMUNITY)

## 2024-04-18 ENCOUNTER — Encounter (HOSPITAL_COMMUNITY): Payer: Self-pay | Admitting: Family Medicine

## 2024-04-18 ENCOUNTER — Other Ambulatory Visit: Payer: Self-pay

## 2024-04-18 ENCOUNTER — Inpatient Hospital Stay (HOSPITAL_COMMUNITY)
Admission: RE | Admit: 2024-04-18 | Discharge: 2024-04-20 | DRG: 807 | Disposition: A | Discharge status: Home / Self Care | Source: Ambulatory Visit | Attending: Obstetrics and Gynecology | Admitting: Obstetrics and Gynecology

## 2024-04-18 DIAGNOSIS — Z3A41 41 weeks gestation of pregnancy: Secondary | ICD-10-CM

## 2024-04-18 DIAGNOSIS — O4202 Full-term premature rupture of membranes, onset of labor within 24 hours of rupture: Secondary | ICD-10-CM | POA: Diagnosis not present

## 2024-04-18 DIAGNOSIS — Z3403 Encounter for supervision of normal first pregnancy, third trimester: Secondary | ICD-10-CM

## 2024-04-18 DIAGNOSIS — O480 Post-term pregnancy: Principal | ICD-10-CM | POA: Diagnosis present

## 2024-04-18 DIAGNOSIS — T7491XA Unspecified adult maltreatment, confirmed, initial encounter: Secondary | ICD-10-CM | POA: Diagnosis present

## 2024-04-18 LAB — TYPE AND SCREEN
ABO/RH(D): O POS
Antibody Screen: NEGATIVE

## 2024-04-18 LAB — RPR: RPR Ser Ql: NONREACTIVE

## 2024-04-18 LAB — CBC
HCT: 34.9 % — ABNORMAL LOW (ref 36.0–46.0)
Hemoglobin: 11.6 g/dL — ABNORMAL LOW (ref 12.0–15.0)
MCH: 28.1 pg (ref 26.0–34.0)
MCHC: 33.2 g/dL (ref 30.0–36.0)
MCV: 84.5 fL (ref 80.0–100.0)
Platelets: 212 10*3/uL (ref 150–400)
RBC: 4.13 MIL/uL (ref 3.87–5.11)
RDW: 16.1 % — ABNORMAL HIGH (ref 11.5–15.5)
WBC: 8 10*3/uL (ref 4.0–10.5)
nRBC: 0 % (ref 0.0–0.2)

## 2024-04-18 MED ORDER — PHENYLEPHRINE 80 MCG/ML (10ML) SYRINGE FOR IV PUSH (FOR BLOOD PRESSURE SUPPORT): 80.0000 ug | PREFILLED_SYRINGE | INTRAVENOUS | Status: DC | PRN

## 2024-04-18 MED ORDER — LIDOCAINE HCL (PF) 1 % IJ SOLN
30.0000 mL | INTRAMUSCULAR | Status: AC | PRN
  Administered 2024-04-18: 30 mL via SUBCUTANEOUS
  Filled 2024-04-18: qty 30

## 2024-04-18 MED ORDER — EPHEDRINE 5 MG/ML INJ: 10.0000 mg | INTRAVENOUS | Status: DC | PRN

## 2024-04-18 MED ORDER — ONDANSETRON HCL 4 MG/2ML IJ SOLN: 4.0000 mg | Freq: Four times a day (QID) | INTRAMUSCULAR | Status: DC | PRN

## 2024-04-18 MED ORDER — WITCH HAZEL-GLYCERIN EX PADS
1.0000 | MEDICATED_PAD | CUTANEOUS | Status: DC | PRN
  Administered 2024-04-19: 1 via TOPICAL

## 2024-04-18 MED ORDER — OXYTOCIN-SODIUM CHLORIDE 30-0.9 UT/500ML-% IV SOLN
1.0000 m[IU]/min | INTRAVENOUS | Status: DC
  Administered 2024-04-18: 1 m[IU]/min via INTRAVENOUS

## 2024-04-18 MED ORDER — ONDANSETRON HCL 4 MG PO TABS: 4.0000 mg | ORAL_TABLET | ORAL | Status: DC | PRN

## 2024-04-18 MED ORDER — ACETAMINOPHEN 325 MG PO TABS: 650.0000 mg | ORAL_TABLET | ORAL | Status: DC | PRN

## 2024-04-18 MED ORDER — OXYTOCIN BOLUS FROM INFUSION
333.0000 mL | Freq: Once | INTRAVENOUS | Status: AC
  Administered 2024-04-18: 333 mL via INTRAVENOUS

## 2024-04-18 MED ORDER — COCONUT OIL OIL: 1.0000 | TOPICAL_OIL | Status: DC | PRN

## 2024-04-18 MED ORDER — DIBUCAINE (PERIANAL) 1 % EX OINT: 1.0000 | TOPICAL_OINTMENT | CUTANEOUS | Status: DC | PRN

## 2024-04-18 MED ORDER — DIPHENHYDRAMINE HCL 25 MG PO CAPS: 25.0000 mg | ORAL_CAPSULE | Freq: Four times a day (QID) | ORAL | Status: DC | PRN

## 2024-04-18 MED ORDER — IBUPROFEN 600 MG PO TABS
600.0000 mg | ORAL_TABLET | Freq: Four times a day (QID) | ORAL | Status: DC
  Administered 2024-04-18 – 2024-04-20 (×5): 600 mg via ORAL
  Filled 2024-04-18 (×5): qty 1

## 2024-04-18 MED ORDER — DIPHENHYDRAMINE HCL 50 MG/ML IJ SOLN: 12.5000 mg | INTRAMUSCULAR | Status: DC | PRN

## 2024-04-18 MED ORDER — OXYTOCIN-SODIUM CHLORIDE 30-0.9 UT/500ML-% IV SOLN
2.5000 [IU]/h | INTRAVENOUS | Status: DC
  Administered 2024-04-18: 2.5 [IU]/h via INTRAVENOUS
  Filled 2024-04-18: qty 500

## 2024-04-18 MED ORDER — ZOLPIDEM TARTRATE 5 MG PO TABS: 5.0000 mg | ORAL_TABLET | Freq: Every evening | ORAL | Status: DC | PRN

## 2024-04-18 MED ORDER — LACTATED RINGERS IV SOLN: 500.0000 mL | Freq: Once | INTRAVENOUS | Status: DC

## 2024-04-18 MED ORDER — LACTATED RINGERS IV SOLN: 500.0000 mL | INTRAVENOUS | Status: DC | PRN

## 2024-04-18 MED ORDER — FENTANYL CITRATE (PF) 100 MCG/2ML IJ SOLN: 50.0000 ug | INTRAMUSCULAR | Status: DC | PRN

## 2024-04-18 MED ORDER — FENTANYL-BUPIVACAINE-NACL 0.5-0.125-0.9 MG/250ML-% EP SOLN: 12.0000 mL/h | EPIDURAL | Status: DC | PRN

## 2024-04-18 MED ORDER — TERBUTALINE SULFATE 1 MG/ML IJ SOLN: 0.2500 mg | Freq: Once | INTRAMUSCULAR | Status: DC | PRN

## 2024-04-18 MED ORDER — ONDANSETRON HCL 4 MG/2ML IJ SOLN
4.0000 mg | INTRAMUSCULAR | Status: DC | PRN
Start: 2024-04-18 — End: 2024-04-20

## 2024-04-18 MED ORDER — BENZOCAINE-MENTHOL 20-0.5 % EX AERO
1.0000 | INHALATION_SPRAY | CUTANEOUS | Status: DC | PRN
  Administered 2024-04-19: 1 via TOPICAL
  Filled 2024-04-18: qty 56

## 2024-04-18 MED ORDER — PRENATAL MULTIVITAMIN CH
1.0000 | ORAL_TABLET | Freq: Every day | ORAL | Status: DC
  Administered 2024-04-19: 1 via ORAL
  Filled 2024-04-18: qty 1

## 2024-04-18 MED ORDER — SOD CITRATE-CITRIC ACID 500-334 MG/5ML PO SOLN: 30.0000 mL | ORAL | Status: DC | PRN

## 2024-04-18 MED ORDER — SENNOSIDES-DOCUSATE SODIUM 8.6-50 MG PO TABS
2.0000 | ORAL_TABLET | ORAL | Status: DC
  Administered 2024-04-19: 2 via ORAL
  Filled 2024-04-18: qty 2

## 2024-04-18 MED ORDER — SIMETHICONE 80 MG PO CHEW: 80.0000 mg | CHEWABLE_TABLET | ORAL | Status: DC | PRN

## 2024-04-18 MED ORDER — ACETAMINOPHEN 325 MG PO TABS
650.0000 mg | ORAL_TABLET | ORAL | Status: DC | PRN
  Administered 2024-04-19: 650 mg via ORAL
  Filled 2024-04-18: qty 2

## 2024-04-18 MED ORDER — MEASLES, MUMPS & RUBELLA VAC IJ SOLR: 0.5000 mL | Freq: Once | INTRAMUSCULAR | Status: DC

## 2024-04-18 MED ORDER — TETANUS-DIPHTH-ACELL PERTUSSIS 5-2.5-18.5 LF-MCG/0.5 IM SUSY: 0.5000 mL | PREFILLED_SYRINGE | Freq: Once | INTRAMUSCULAR | Status: DC

## 2024-04-18 MED ORDER — LACTATED RINGERS IV SOLN: INTRAVENOUS | Status: DC

## 2024-04-18 MED ORDER — OXYCODONE HCL 5 MG PO TABS: 5.0000 mg | ORAL_TABLET | ORAL | Status: DC | PRN

## 2024-04-18 NOTE — Progress Notes (Signed)
 Labor Progress Note Kimberly Wise is a 23 y.o. G1P0000 at [redacted]w[redacted]d presented for admitted for IOL S: desires to get into the tub, will turn Pitocin  off to facilitate.   O:  BP 118/75   Pulse 91   Temp 98.3 F (36.8 C) (Oral)   Resp 17   Ht 5\' 6"  (1.676 m)   Wt 83.5 kg   LMP 07/06/2023 (Exact Date)   BMI 29.71 kg/m   EFM: baseline 150, accels, no decels, moderate variability TOCO: no contractions  CVE: Dilation: 4 Effacement (%): 70 Cervical Position: Posterior Station: -3 Presentation:  (not well applied) Exam by:: Chrys Cranker   A&P: 23 y.o. G1P0000 [redacted]w[redacted]d admitted for IOL #Labor: Progressing well. Will d/c pitocin . Start hydrotherapy and intermittent ausculation. If contractions space >8 minutes will d/c hydrotherapy and restart pitocin  with continuous EFM.  #Pain: Coping well #FWB: Cat I #GBS negative   Darrow End, MD 1:55 PM
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
## 2024-04-18 NOTE — Plan of Care (Signed)
   Problem: Education: Goal: Knowledge of General Education information will improve Description: Including pain rating scale, medication(s)/side effects and non-pharmacologic comfort measures Outcome: Completed/Met   Problem: Health Behavior/Discharge Planning: Goal: Ability to manage health-related needs will improve Outcome: Completed/Met   Problem: Clinical Measurements: Goal: Ability to maintain clinical measurements within normal limits will improve Outcome: Completed/Met Goal: Will remain free from infection Outcome: Completed/Met Goal: Diagnostic test results will improve Outcome: Completed/Met Goal: Respiratory complications will improve Outcome: Completed/Met Goal: Cardiovascular complication will be avoided Outcome: Completed/Met   Problem: Activity: Goal: Risk for activity intolerance will decrease Outcome: Completed/Met   Problem: Nutrition: Goal: Adequate nutrition will be maintained Outcome: Completed/Met   Problem: Coping: Goal: Level of anxiety will decrease Outcome: Completed/Met   Problem: Elimination: Goal: Will not experience complications related to bowel motility Outcome: Completed/Met Goal: Will not experience complications related to urinary retention Outcome: Completed/Met   Problem: Pain Managment: Goal: General experience of comfort will improve and/or be controlled Outcome: Completed/Met   Problem: Safety: Goal: Ability to remain free from injury will improve Outcome: Completed/Met   Problem: Skin Integrity: Goal: Risk for impaired skin integrity will decrease Outcome: Completed/Met   Problem: Education: Goal: Knowledge of Childbirth will improve Outcome: Completed/Met Goal: Ability to make informed decisions regarding treatment and plan of care will improve Outcome: Completed/Met Goal: Ability to state and carry out methods to decrease the pain will improve Outcome: Completed/Met Goal: Individualized Educational Video(s) Outcome:  Completed/Met   Problem: Coping: Goal: Ability to verbalize concerns and feelings about labor and delivery will improve Outcome: Completed/Met   Problem: Life Cycle: Goal: Ability to make normal progression through stages of labor will improve Outcome: Completed/Met Goal: Ability to effectively push during vaginal delivery will improve Outcome: Completed/Met   Problem: Role Relationship: Goal: Will demonstrate positive interactions with the child Outcome: Completed/Met   Problem: Safety: Goal: Risk of complications during labor and delivery will decrease Outcome: Completed/Met   Problem: Pain Management: Goal: Relief or control of pain from uterine contractions will improve Outcome: Completed/Met

## 2024-04-18 NOTE — Progress Notes (Signed)
 Labor Progress Note Kimberly Wise is a 23 y.o. G1P0000 at [redacted]w[redacted]d presented for SOL S: Coping amazingly  O:  BP 118/75   Pulse 91   Temp 98 F (36.7 C) (Oral)   Resp 17   Ht 5\' 6"  (1.676 m)   Wt 83.5 kg   LMP 07/06/2023 (Exact Date)   BMI 29.71 kg/m   EFM: baseline 140s TOCO: q3-64min contractions  CVE: Dilation: Lip/rim Dilation Complete Date: 04/18/24 Dilation Complete Time: 1719 Effacement (%): 70 Cervical Position: Posterior Station: Plus 2 Presentation: Vertex Exam by:: Destenie Ingber MD   A&P: 23 y.o. G1P0000 [redacted]w[redacted]d admitted for SOL #Labor: Progressing slowly without pitocin , but desires to remain immersed #Pain: Hydrotherapy #FWB: Baseline 140s #GBS negative   Darrow End, MD 8:04 PM

## 2024-04-18 NOTE — Progress Notes (Signed)
 Labor Progress Note Kimberly Wise is a 23 y.o. G1P0000 at [redacted]w[redacted]d presented for SOL S: coping amazingly, pushing with urge in the tub. Posterior cervix remained on standing check.   O:  BP 118/75   Pulse 91   Temp 98.1 F (36.7 C) (Oral)   Resp 17   Ht 5\' 6"  (1.676 m)   Wt 83.5 kg   LMP 07/06/2023 (Exact Date)   BMI 29.71 kg/m   Intermittent auscultation with contractions in the 130s  CVE: Dilation: Lip/rim Dilation Complete Date: 04/18/24 Dilation Complete Time: 1719 Effacement (%): 70 Cervical Position: Posterior Station: Plus 2 Presentation: Vertex Exam by:: Chalet Kerwin MD   A&P: 23 y.o. G1P0000 [redacted]w[redacted]d admitted for SOL #Labor: Progressing well. Progressing well. SROM. Pitocin  discontinued at 1400 when entered started immersion therapy.  #Pain: Hydrotherapy #FWB: baseline 130s #GBS negative   Darrow End, MD 7:01 PM

## 2024-04-19 NOTE — Progress Notes (Signed)
 Post Partum Day #1 Subjective: no complaints, up ad lib, and tolerating PO; breastfeeding going okay; is contemplating Nexplanon for contraception, but definitely doesn't want it inpatient; she expressed a desire to have her son circumcised- she was consented and a note placed in his chart  Objective: Blood pressure 116/68, pulse 84, temperature 98 F (36.7 C), temperature source Oral, resp. rate 14, height 5\' 6"  (1.676 m), weight 83.5 kg, last menstrual period 07/06/2023, SpO2 98%, unknown if currently breastfeeding.  Physical Exam:  General: alert, cooperative, and no distress Lochia: appropriate Uterine Fundus: firm DVT Evaluation: No evidence of DVT seen on physical exam.  Recent Labs    04/18/24 0430  HGB 11.6*  HCT 34.9*    Assessment/Plan: Plan for discharge tomorrow and Circumcision prior to discharge   LOS: 1 day   [name], CNM 04/19/2024, 9:15 AM

## 2024-04-19 NOTE — Lactation Note (Signed)
 This note was copied from a baby's chart. Lactation Consultation Note  Patient Name: Kimberly Wise WNUUV'O Date: 04/19/2024 Age:21 hours Reason for consult: Initial assessment;1st time breastfeeding;Term Per MOB, did not attempt to latch infant in L&D. MOB was given support pillows did reverse pressure softening to help evert nipple shaft out more prior to latch. MOB latched infant using the cross cradle hold on her left breast, infant was on and off the breast, not sustaining his latch for 10 minutes. Afterwards, MOB was taught hand expression with breast model and MOB expressed 5 mls of colostrum that was spoon fed to infant. MOB will continue to breastfeed infant by cues, on demand, every 2-3 hours, skin to skin. MOB knows to call for further latch assistance if needed. LC discussed the importance of maternal rest, diet and hydration.   Maternal Data Has patient been taught Hand Expression?: Yes Does the patient have breastfeeding experience prior to this delivery?: No  Feeding Mother's Current Feeding Choice: Breast Milk and Formula  LATCH Score Latch: Repeated attempts needed to sustain latch, nipple held in mouth throughout feeding, stimulation needed to elicit sucking reflex.  Audible Swallowing: A few with stimulation  Type of Nipple: Flat  Comfort (Breast/Nipple): Soft / non-tender  Hold (Positioning): Assistance needed to correctly position infant at breast and maintain latch.  LATCH Score: 6   Lactation Tools Discussed/Used    Interventions Interventions: Breast feeding basics reviewed;Assisted with latch;Skin to skin;Breast compression;Adjust position;Support pillows;Position options;Expressed milk;Hand express;Reverse pressure;Education;LC Services brochure  Discharge    Consult Status Consult Status: Follow-up Date: 04/20/24 Follow-up type: In-patient    Kimberly Wise 04/19/2024, 2:07 AM

## 2024-04-20 ENCOUNTER — Other Ambulatory Visit (HOSPITAL_COMMUNITY): Payer: Self-pay

## 2024-04-20 MED ORDER — ACETAMINOPHEN 325 MG PO TABS
650.0000 mg | ORAL_TABLET | ORAL | 0 refills | Status: AC | PRN
  Filled 2024-04-20: qty 30, 3d supply, fill #0

## 2024-04-20 MED ORDER — IBUPROFEN 600 MG PO TABS
600.0000 mg | ORAL_TABLET | Freq: Four times a day (QID) | ORAL | 0 refills | Status: AC
  Filled 2024-04-20: qty 30, 8d supply, fill #0

## 2024-04-20 MED ORDER — SENNOSIDES-DOCUSATE SODIUM 8.6-50 MG PO TABS
2.0000 | ORAL_TABLET | ORAL | 0 refills | Status: AC
  Filled 2024-04-20: qty 30, 15d supply, fill #0
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
## 2024-04-20 NOTE — Lactation Note (Addendum)
 This note was copied from a baby's chart. Lactation Consultation Note  Patient Name: Kimberly Wise Date: 04/20/2024 Age:22 hours  Reason for consult: Follow-up assessment;Breastfeeding assistance;Primapara;1st time breastfeeding;Other (Comment);Term (Fibroadenoma Rt breast/ radioactive seed-2022)  P1, [redacted]w[redacted]d, 5% weight loss  Follow up visit with mother and baby "Kimberly Wise". Mother states she is working on breastfeeding but baby isn't latching. She reports spoon feeding her colostrum. In the last 24 hrs, baby has been formula feeding and tolerating well. Baby just had 40 ml of formula.  Mother is receptive to [facility] assist with breastfeeding. Mother has large breast and assisted her with football position. She liked the position and baby was placed beside her breast. Mother can easily hand express colostrum from both breast. Right nipple/ areola complex is edematous and firm. Mother was taught reverse pressure softening. Baby switched to left breast because breast was leaking. Baby is able to latch to this breast but he acted full from formula.  He is licked, colostrum was expressed but baby did not make efforts to latch.  Mother is motivated to breast feed especially now that she is seeing an increase in milk.   Discussed the process of milk production, "supply and demand" and the importance of breast stimulation and milk removal in order to make an optimal milk supply. Discussed mother to breastfeed 8-12 times in 24 hours, skin to skin and breast feed before formula feeding.   If missed feedings at breast or substituting feeding with formula, advised to hand express and/or pump to remove milk from the breast.   Mother reports that she has an OP Lactation appointment at 0830 on Monday, 04-23-2024.    Feeding Mother's Current Feeding Choice: Breast Milk and Formula  LATCH Score Latch: Too sleepy or reluctant, no latch achieved, no sucking elicited. (content, was full after 40 ml of  formula)   Interventions Interventions: Breast feeding basics reviewed;Assisted with latch;Skin to skin;Hand express;Adjust position;Support pillows;Education  Discharge Discharge Education: Engorgement and breast care;Warning signs for feeding baby;Outpatient recommendation Pump: Personal;Manual;DEBP  Consult Status Consult Status: Complete Date: 04/20/24    Esperanza Hedges 04/20/2024, 11:27 AM

## 2024-04-20 NOTE — Patient Instructions (Signed)
 Your appointment with Outpatient Lactation is: Monday 4.28.2025 at 8:30am [facility] (First Floor) [address] [place]  Check in under baby's name.  Please bring your baby hungry along with your pump and a bottle of either formula or expressed breast milk. Please also bring your pump flanges and we welcome support people! If you need lactation assistance before your appointment, please call [phone] and press 4 for lactation.

## 2024-04-30 ENCOUNTER — Telehealth (HOSPITAL_COMMUNITY): Payer: Self-pay

## 2024-04-30 NOTE — Telephone Encounter (Signed)
 04/30/2024 1114  Name: Kimberly Wise MRN: [id number] DOB: Mar 10, 2001  Reason for Call:  Transition of Care Hospital Discharge Call  Contact Status: Patient Contact Status: Message  Language assistant needed:          Follow-Up Questions:    Dimple Francis Postnatal Depression Scale:  In the Past 7 Days:    PHQ2-9 Depression Scale:     Discharge Follow-up:    Post-discharge interventions: NA  Signature  Wadell Guild

## 2024-05-24 ENCOUNTER — Encounter: Payer: Self-pay | Admitting: Family Medicine

## 2024-05-24 ENCOUNTER — Ambulatory Visit: Admitting: Family Medicine

## 2024-05-24 ENCOUNTER — Other Ambulatory Visit (HOSPITAL_COMMUNITY)
Admission: RE | Admit: 2024-05-24 | Discharge: 2024-05-24 | Disposition: A | Discharge status: Home / Self Care | Source: Ambulatory Visit | Attending: Family Medicine | Admitting: Family Medicine

## 2024-05-24 DIAGNOSIS — Z124 Encounter for screening for malignant neoplasm of cervix: Secondary | ICD-10-CM | POA: Insufficient documentation

## 2024-05-24 NOTE — Patient Instructions (Signed)

## 2024-05-24 NOTE — Progress Notes (Signed)
 Post Partum Visit Note  Kimberly Wise is a 23 y.o. G70P1001 female who presents for a postpartum visit. She is 5 weeks postpartum following a normal spontaneous vaginal delivery.  I have fully reviewed the prenatal and intrapartum course. The delivery was at 41 gestational weeks.  Anesthesia: none. Postpartum course has been uneventful. Baby is doing well. Baby is feeding by both breast and bottle - similac 360 total care. Bleeding no bleeding. Bowel function is normal. Bladder function is normal. Patient is sexually active. Contraception method is none. Postpartum depression screening: negative.   The pregnancy intention screening data noted above was reviewed. Potential methods of contraception were discussed. The patient elected to proceed with No data recorded.   Edinburgh Postnatal Depression Scale - 05/24/24 1554       Edinburgh Postnatal Depression Scale:  In the Past 7 Days   I have been able to laugh and see the funny side of things. 0    I have looked forward with enjoyment to things. 0    I have blamed myself unnecessarily when things went wrong. 0    I have been anxious or worried for no good reason. 0    I have felt scared or panicky for no good reason. 0    I have been so unhappy that I have had difficulty sleeping. 0    I have felt sad or miserable. 0    I have been so unhappy that I have been crying. 0    The thought of harming myself has occurred to me. 0             Health Maintenance Due  Topic Date Due   COVID-19 Vaccine (1) Never done   HPV VACCINES (1 - Risk 3-dose series) Never done   Meningococcal B Vaccine (1 of 2 - Standard) Never done   Cervical Cancer Screening (Pap smear)  Never done    The following portions of the patient's history were reviewed and updated as appropriate: allergies, current medications, past family history, past medical history, past social history, past surgical history, and problem list.  Review of Systems Pertinent items  noted in HPI and remainder of comprehensive ROS otherwise negative.  Objective:  BP 123/78   Pulse 87   Wt 162 lb 12.8 oz (73.8 kg)   LMP 07/06/2023 (Exact Date)   BMI 26.28 kg/m    General:  alert, cooperative, and appears stated age   Breasts:  not indicated  Lungs: Comfortalbe on room air  Wound N/a  GU exam:  normal        Assessment:   Postpartum care and examination  Screening for cervical cancer  Normal postpartum exam.   Plan:   Essential components of care per ACOG recommendations:  1.  Mood and well being: Patient with negative depression screening today. Reviewed local resources for support.  - Patient tobacco use? No.   - hx of drug use? No.    2. Infant care and feeding:  -Patient currently breastmilk feeding? Yes. Reviewed importance of draining breast regularly to support lactation.  -Social determinants of health (SDOH) reviewed in EPIC. No concerns  3. Sexuality, contraception and birth spacing - Patient does not want a pregnancy in the next year.  Desired family size is unsure.  - Reviewed reproductive life planning. Reviewed contraceptive methods based on pt preferences and effectiveness.  Patient desired IUD but not ready to place today. Will have her back in 3-4 weeks for insertion.   -  Discussed birth spacing of 18 months  4. Sleep and fatigue -Encouraged family/partner/community support of 4 hrs of uninterrupted sleep to help with mood and fatigue  5. Physical Recovery  - Discussed patients delivery and complications. She describes her labor as good. - Patient had a Vaginal, no problems at delivery. Patient had a 1st degree laceration. Perineal healing reviewed. Patient expressed understanding - Patient has urinary incontinence? No. - Patient is safe to resume physical and sexual activity  6.  Health Maintenance - HM due items addressed Yes - Last pap smear No results found for: "DIAGPAP" Pap smear done at today's visit.  -Breast Cancer  screening indicated? No.   7. Chronic Disease/Pregnancy Condition follow up: None  - PCP follow up  Teena Feast, MD [facility] for [facility], [facility]

## 2024-05-29 LAB — CYTOLOGY - PAP
Chlamydia: NEGATIVE
Comment: NEGATIVE
Comment: NEGATIVE
Comment: NORMAL
Diagnosis: NEGATIVE
Neisseria Gonorrhea: NEGATIVE
Trichomonas: NEGATIVE

## 2024-06-01 ENCOUNTER — Ambulatory Visit: Payer: Self-pay | Admitting: Family Medicine

## 2024-06-25 ENCOUNTER — Other Ambulatory Visit: Payer: Self-pay

## 2024-06-25 ENCOUNTER — Ambulatory Visit: Admitting: Family Medicine

## 2024-06-25 VITALS — BP 107/67 | HR 71 | Wt 174.0 lb

## 2024-06-25 DIAGNOSIS — Z3202 Encounter for pregnancy test, result negative: Secondary | ICD-10-CM | POA: Diagnosis not present

## 2024-06-25 DIAGNOSIS — Z30430 Encounter for insertion of intrauterine contraceptive device: Secondary | ICD-10-CM | POA: Diagnosis not present

## 2024-06-25 DIAGNOSIS — Z1331 Encounter for screening for depression: Secondary | ICD-10-CM | POA: Diagnosis not present

## 2024-06-25 LAB — POCT PREGNANCY, URINE: Preg Test, Ur: NEGATIVE

## 2024-06-25 MED ORDER — PARAGARD INTRAUTERINE COPPER IU IUD
1.0000 | INTRAUTERINE_SYSTEM | Freq: Once | INTRAUTERINE | Status: AC
  Administered 2024-06-25: 1 via INTRAUTERINE

## 2024-06-25 MED ORDER — LEVONORGESTREL 20 MCG/DAY IU IUD: 1.0000 | INTRAUTERINE_SYSTEM | Freq: Once | INTRAUTERINE | Status: DC

## 2024-06-25 NOTE — Progress Notes (Unsigned)
    Subjective:  Kimberly Wise is a 23 y.o. female who presents to the clinic today for ParaGard IUD placement  HPI: Patient presenting for introduction of ParaGard IUD.  Has previously discussed all measures of contraception is decided on the ParaGard.  Counseled on risk and benefits.  Objective:  Physical Exam: BP 107/67   Pulse 71   Wt 174 lb (78.9 kg)   LMP 06/23/2024 (Exact Date)   Breastfeeding Yes   BMI 28.08 kg/m   Gen: Alert NAD, resting comfortably CV: Regular rate Pulm: Normal work of breathing, speaking full sentences GI: Normal bowel sounds present. Soft, Nontender, Nondistended. MSK: no edema, cyanosis, or clubbing noted Skin: warm, dry Neuro: grossly normal, moves all extremities Psych: Normal affect and thought content  Results for orders placed or performed in visit on 06/25/24 (from the past 72 hours)  Pregnancy, urine POC     Status: None   Collection Time: 06/25/24  2:31 PM  Result Value Ref Range   Preg Test, Ur NEGATIVE NEGATIVE    Comment:        THE SENSITIVITY OF THIS METHODOLOGY IS >20 mIU/mL.    IUD INSERTION: Patient given informed consent, signed copy in the chart..  Negative pregnancy confirmed.  Appropriate time out taken.   Sterile instruments and technique was used. Cervix brought into view with use of speculum and then cleansed three times with  betadine swabs.  A tenaculum was placed into the anterior lip of the cervix and a uterine sound was used to measure uterine size.   A Paragard IUD was placed into the endometrial cavity, deployed and secured. The applicator was removed. The strings were trimmed to 2 centimeters.   There were no complications and the patient tolerated the procedure well.   She was given handouts for post procedure instructions and information about the IUD including a card with the time of recommended removal. She was reminded that the IUD does not protect against sexually transmitted diseases. LOT [id number] Exp aug  2027  Assessment/Plan:  Encounter for IUD insertion ParaGard IUD placed without difficulty.  Patient tolerated procedure well.   Lab Orders         Pregnancy, urine POC     Meds ordered this encounter  Medications   DISCONTD: levonorgestrel (MIRENA) 20 MCG/DAY IUD 1 each   paragard intrauterine copper IUD 1 each      Victor Delores Thelen, MD Attending Family Medicine Physician, [facility] for [facility], [facility]   06/26/24 4:52 PM

## 2024-06-26 DIAGNOSIS — Z30430 Encounter for insertion of intrauterine contraceptive device: Secondary | ICD-10-CM | POA: Insufficient documentation

## 2024-06-26 NOTE — Assessment & Plan Note (Signed)
 ParaGard IUD placed without difficulty.  Patient tolerated procedure well.

## 2024-07-16 ENCOUNTER — Encounter: Payer: Self-pay | Admitting: Family Medicine

## 2024-07-17 ENCOUNTER — Encounter: Payer: Self-pay | Admitting: Family Medicine
